# Patient Record
Sex: Male | Born: 1990 | Race: White | Hispanic: No | Marital: Single | Smoking: Current every day smoker
Health system: Southern US, Community
[De-identification: ages and names within clinical notes are randomized; demographics above are authoritative.]

## PROBLEM LIST (undated history)

## (undated) DIAGNOSIS — F419 Anxiety disorder, unspecified: Secondary | ICD-10-CM

## (undated) DIAGNOSIS — F1910 Other psychoactive substance abuse, uncomplicated: Secondary | ICD-10-CM

## (undated) DIAGNOSIS — J45909 Unspecified asthma, uncomplicated: Secondary | ICD-10-CM

## (undated) DIAGNOSIS — R569 Unspecified convulsions: Secondary | ICD-10-CM

## (undated) HISTORY — PX: ORBITAL FRACTURE SURGERY: SHX725

---
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
## 2015-04-16 ENCOUNTER — Encounter (HOSPITAL_COMMUNITY): Payer: Self-pay | Admitting: Emergency Medicine

## 2015-04-16 ENCOUNTER — Inpatient Hospital Stay (HOSPITAL_COMMUNITY)
Admission: EM | Admit: 2015-04-16 | Discharge: 2015-04-18 | DRG: 917 | Disposition: A | Discharge status: Home / Self Care | Payer: Self-pay | Attending: Internal Medicine | Admitting: Internal Medicine

## 2015-04-16 DIAGNOSIS — F1910 Other psychoactive substance abuse, uncomplicated: Secondary | ICD-10-CM | POA: Diagnosis present

## 2015-04-16 DIAGNOSIS — T401X1A Poisoning by heroin, accidental (unintentional), initial encounter: Principal | ICD-10-CM | POA: Diagnosis present

## 2015-04-16 DIAGNOSIS — R404 Transient alteration of awareness: Secondary | ICD-10-CM | POA: Diagnosis present

## 2015-04-16 DIAGNOSIS — Y907 Blood alcohol level of 200-239 mg/100 ml: Secondary | ICD-10-CM | POA: Diagnosis present

## 2015-04-16 DIAGNOSIS — J45901 Unspecified asthma with (acute) exacerbation: Secondary | ICD-10-CM | POA: Diagnosis present

## 2015-04-16 DIAGNOSIS — R0602 Shortness of breath: Secondary | ICD-10-CM

## 2015-04-16 DIAGNOSIS — Y92009 Unspecified place in unspecified non-institutional (private) residence as the place of occurrence of the external cause: Secondary | ICD-10-CM

## 2015-04-16 DIAGNOSIS — F1410 Cocaine abuse, uncomplicated: Secondary | ICD-10-CM | POA: Diagnosis present

## 2015-04-16 DIAGNOSIS — F1310 Sedative, hypnotic or anxiolytic abuse, uncomplicated: Secondary | ICD-10-CM | POA: Diagnosis present

## 2015-04-16 DIAGNOSIS — F17210 Nicotine dependence, cigarettes, uncomplicated: Secondary | ICD-10-CM | POA: Diagnosis present

## 2015-04-16 DIAGNOSIS — J9601 Acute respiratory failure with hypoxia: Secondary | ICD-10-CM | POA: Diagnosis present

## 2015-04-16 DIAGNOSIS — J9602 Acute respiratory failure with hypercapnia: Secondary | ICD-10-CM | POA: Diagnosis present

## 2015-04-16 DIAGNOSIS — F1010 Alcohol abuse, uncomplicated: Secondary | ICD-10-CM | POA: Diagnosis present

## 2015-04-16 DIAGNOSIS — R4189 Other symptoms and signs involving cognitive functions and awareness: Secondary | ICD-10-CM | POA: Diagnosis present

## 2015-04-16 LAB — COMPREHENSIVE METABOLIC PANEL
ALT: 20 U/L (ref 17–63)
AST: 32 U/L (ref 15–41)
Albumin: 3.9 g/dL (ref 3.5–5.0)
Alkaline Phosphatase: 72 U/L (ref 38–126)
Anion gap: 13 (ref 5–15)
BUN: <5 mg/dL — ABNORMAL LOW (ref 6–20)
CO2: 21 mmol/L — ABNORMAL LOW (ref 22–32)
Calcium: 8.8 mg/dL — ABNORMAL LOW (ref 8.9–10.3)
Chloride: 101 mmol/L (ref 101–111)
Creatinine, Ser: 0.91 mg/dL (ref 0.61–1.24)
GFR calc Af Amer: >60 mL/min (ref >60)
GFR calc non Af Amer: >60 mL/min (ref >60)
Glucose, Bld: 153 mg/dL — ABNORMAL HIGH (ref 65–99)
Potassium: 3.7 mmol/L (ref 3.5–5.1)
Sodium: 135 mmol/L (ref 135–145)
Total Bilirubin: 0.7 mg/dL (ref 0.3–1.2)
Total Protein: 7.4 g/dL (ref 6.5–8.1)

## 2015-04-16 LAB — RAPID URINE DRUG SCREEN, HOSP PERFORMED
Amphetamines: NOT DETECTED
Barbiturates: NOT DETECTED
Benzodiazepines: POSITIVE — AB
Cocaine: POSITIVE — AB
Opiates: POSITIVE — AB
Tetrahydrocannabinol: NOT DETECTED

## 2015-04-16 LAB — ACETAMINOPHEN LEVEL: Acetaminophen (Tylenol), Serum: <10 ug/mL — ABNORMAL LOW (ref 10–30)

## 2015-04-16 LAB — CBC WITH DIFFERENTIAL/PLATELET
Basophils Absolute: 0 10*3/uL (ref 0.0–0.1)
Basophils Relative: 0 % (ref 0–1)
Eosinophils Absolute: 0.5 10*3/uL (ref 0.0–0.7)
Eosinophils Relative: 7 % — ABNORMAL HIGH (ref 0–5)
HCT: 40.7 % (ref 39.0–52.0)
Hemoglobin: 14.4 g/dL (ref 13.0–17.0)
Lymphocytes Relative: 37 % (ref 12–46)
Lymphs Abs: 2.9 10*3/uL (ref 0.7–4.0)
MCH: 31.9 pg (ref 26.0–34.0)
MCHC: 35.4 g/dL (ref 30.0–36.0)
MCV: 90.2 fL (ref 78.0–100.0)
Monocytes Absolute: 0.5 10*3/uL (ref 0.1–1.0)
Monocytes Relative: 7 % (ref 3–12)
Neutro Abs: 3.8 10*3/uL (ref 1.7–7.7)
Neutrophils Relative %: 49 % (ref 43–77)
Platelets: 254 10*3/uL (ref 150–400)
RBC: 4.51 MIL/uL (ref 4.22–5.81)
RDW: 14.5 % (ref 11.5–15.5)
WBC: 7.8 10*3/uL (ref 4.0–10.5)

## 2015-04-16 LAB — SALICYLATE LEVEL: Salicylate Lvl: <4 mg/dL (ref 2.8–30.0)

## 2015-04-16 LAB — ETHANOL: Alcohol, Ethyl (B): 202 mg/dL — ABNORMAL HIGH (ref <5)

## 2015-04-16 LAB — I-STAT TROPONIN, ED: Troponin i, poc: 0.01 ng/mL (ref 0.00–0.08)

## 2015-04-16 MED ORDER — ONDANSETRON HCL 4 MG/2ML IJ SOLN
4.0000 mg | Freq: Once | INTRAMUSCULAR | Status: AC
  Administered 2015-04-17: 4 mg via INTRAVENOUS
  Filled 2015-04-16: qty 2

## 2015-04-16 MED ORDER — SODIUM CHLORIDE 0.9 % IV BOLUS (SEPSIS)
1000.0000 mL | Freq: Once | INTRAVENOUS | Status: AC
  Administered 2015-04-16: 1000 mL via INTRAVENOUS

## 2015-04-16 MED ORDER — CLONIDINE HCL 0.1 MG PO TABS
0.1000 mg | ORAL_TABLET | Freq: Once | ORAL | Status: AC
  Administered 2015-04-17: 0.1 mg via ORAL
  Filled 2015-04-16: qty 1

## 2015-04-16 NOTE — ED Notes (Signed)
Per EMS: pt found unresponsive and not breathing by father at 2130, upon arrival by fire department pt received 2mg  of narcan and became responsive. Pt states unknown amount of ingestion and unknown time frame. Pt alert and oriented, nad noted upon arrival. Pt noted to have alcohol problem and also states he takes xanax q3h that he gets for his panic attacks.
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
## 2015-04-16 NOTE — ED Notes (Signed)
Spoke with Sheryl at poison control, recommend that pt gets EKG done and reassessed 4 hours after last dose of narcan. Dr. Silverio Lay notified.

## 2015-04-17 ENCOUNTER — Inpatient Hospital Stay (HOSPITAL_COMMUNITY): Payer: Self-pay

## 2015-04-17 DIAGNOSIS — R4189 Other symptoms and signs involving cognitive functions and awareness: Secondary | ICD-10-CM | POA: Diagnosis present

## 2015-04-17 DIAGNOSIS — F1910 Other psychoactive substance abuse, uncomplicated: Secondary | ICD-10-CM | POA: Diagnosis present

## 2015-04-17 DIAGNOSIS — J45901 Unspecified asthma with (acute) exacerbation: Secondary | ICD-10-CM | POA: Diagnosis present

## 2015-04-17 DIAGNOSIS — T401X1A Poisoning by heroin, accidental (unintentional), initial encounter: Secondary | ICD-10-CM | POA: Diagnosis present

## 2015-04-17 DIAGNOSIS — J9601 Acute respiratory failure with hypoxia: Secondary | ICD-10-CM

## 2015-04-17 DIAGNOSIS — J9602 Acute respiratory failure with hypercapnia: Secondary | ICD-10-CM

## 2015-04-17 LAB — CBC WITH DIFFERENTIAL/PLATELET
BASOS ABS: 0 10*3/uL (ref 0.0–0.1)
BASOS PCT: 0 % (ref 0–1)
EOS ABS: 0.1 10*3/uL (ref 0.0–0.7)
EOS PCT: 1 % (ref 0–5)
HEMATOCRIT: 38.9 % — AB (ref 39.0–52.0)
Hemoglobin: 13.5 g/dL (ref 13.0–17.0)
LYMPHS ABS: 0.6 10*3/uL — AB (ref 0.7–4.0)
Lymphocytes Relative: 7 % — ABNORMAL LOW (ref 12–46)
MCH: 31.5 pg (ref 26.0–34.0)
MCHC: 34.7 g/dL (ref 30.0–36.0)
MCV: 90.9 fL (ref 78.0–100.0)
Monocytes Absolute: 0.2 10*3/uL (ref 0.1–1.0)
Monocytes Relative: 2 % — ABNORMAL LOW (ref 3–12)
NEUTROS ABS: 7.3 10*3/uL (ref 1.7–7.7)
Neutrophils Relative %: 90 % — ABNORMAL HIGH (ref 43–77)
Platelets: 222 10*3/uL (ref 150–400)
RBC: 4.28 MIL/uL (ref 4.22–5.81)
RDW: 14.7 % (ref 11.5–15.5)
WBC: 8.2 10*3/uL (ref 4.0–10.5)

## 2015-04-17 LAB — COMPREHENSIVE METABOLIC PANEL
ALBUMIN: 3.5 g/dL (ref 3.5–5.0)
ALK PHOS: 67 U/L (ref 38–126)
ALT: 18 U/L (ref 17–63)
AST: 23 U/L (ref 15–41)
Anion gap: 9 (ref 5–15)
BILIRUBIN TOTAL: 0.7 mg/dL (ref 0.3–1.2)
BUN: <5 mg/dL — ABNORMAL LOW (ref 6–20)
CHLORIDE: 101 mmol/L (ref 101–111)
CO2: 25 mmol/L (ref 22–32)
Calcium: 9.2 mg/dL (ref 8.9–10.3)
Creatinine, Ser: 0.76 mg/dL (ref 0.61–1.24)
Glucose, Bld: 138 mg/dL — ABNORMAL HIGH (ref 65–99)
POTASSIUM: 4.9 mmol/L (ref 3.5–5.1)
Sodium: 135 mmol/L (ref 135–145)
Total Protein: 6.9 g/dL (ref 6.5–8.1)

## 2015-04-17 LAB — I-STAT ARTERIAL BLOOD GAS, ED
ACID-BASE DEFICIT: 4 mmol/L — AB (ref 0.0–2.0)
Bicarbonate: 23.3 mEq/L (ref 20.0–24.0)
O2 Saturation: 97 %
PH ART: 7.286 — AB (ref 7.350–7.450)
TCO2: 25 mmol/L (ref 0–100)
pCO2 arterial: 48.5 mmHg — ABNORMAL HIGH (ref 35.0–45.0)
pO2, Arterial: 97 mmHg (ref 80.0–100.0)

## 2015-04-17 LAB — EXPECTORATED SPUTUM ASSESSMENT W REFEX TO RESP CULTURE

## 2015-04-17 LAB — EXPECTORATED SPUTUM ASSESSMENT W GRAM STAIN, RFLX TO RESP C

## 2015-04-17 LAB — MRSA PCR SCREENING: MRSA by PCR: POSITIVE — AB

## 2015-04-17 MED ORDER — ONDANSETRON HCL 4 MG/2ML IJ SOLN: 4.0000 mg | Freq: Four times a day (QID) | INTRAMUSCULAR | Status: DC | PRN

## 2015-04-17 MED ORDER — DICYCLOMINE HCL 20 MG PO TABS
20.0000 mg | ORAL_TABLET | Freq: Four times a day (QID) | ORAL | Status: DC | PRN
  Filled 2015-04-17: qty 1

## 2015-04-17 MED ORDER — IPRATROPIUM-ALBUTEROL 0.5-2.5 (3) MG/3ML IN SOLN
3.0000 mL | RESPIRATORY_TRACT | Status: DC
  Administered 2015-04-17 – 2015-04-18 (×7): 3 mL via RESPIRATORY_TRACT
  Filled 2015-04-17 (×6): qty 3

## 2015-04-17 MED ORDER — METHOCARBAMOL 500 MG PO TABS
500.0000 mg | ORAL_TABLET | Freq: Three times a day (TID) | ORAL | Status: DC | PRN
  Filled 2015-04-17: qty 1

## 2015-04-17 MED ORDER — ACETAMINOPHEN 325 MG PO TABS: 650.0000 mg | ORAL_TABLET | Freq: Four times a day (QID) | ORAL | Status: DC | PRN

## 2015-04-17 MED ORDER — THIAMINE HCL 100 MG/ML IJ SOLN
Freq: Once | INTRAVENOUS | Status: AC
  Administered 2015-04-17: 07:00:00 via INTRAVENOUS
  Filled 2015-04-17: qty 1000

## 2015-04-17 MED ORDER — IPRATROPIUM-ALBUTEROL 0.5-2.5 (3) MG/3ML IN SOLN
3.0000 mL | RESPIRATORY_TRACT | Status: DC | PRN
  Filled 2015-04-17: qty 3

## 2015-04-17 MED ORDER — NAPROXEN 500 MG PO TABS
500.0000 mg | ORAL_TABLET | Freq: Two times a day (BID) | ORAL | Status: DC | PRN
  Filled 2015-04-17: qty 1

## 2015-04-17 MED ORDER — LOPERAMIDE HCL 2 MG PO CAPS
2.0000 mg | ORAL_CAPSULE | ORAL | Status: DC | PRN
  Filled 2015-04-17: qty 2

## 2015-04-17 MED ORDER — CLONIDINE HCL 0.1 MG PO TABS: 0.1000 mg | ORAL_TABLET | Freq: Every day | ORAL | Status: DC

## 2015-04-17 MED ORDER — DEXTROSE 5 % IV SOLN
1.0000 g | INTRAVENOUS | Status: DC
  Administered 2015-04-17 – 2015-04-18 (×2): 1 g via INTRAVENOUS
  Filled 2015-04-17 (×4): qty 10

## 2015-04-17 MED ORDER — DIAZEPAM 5 MG/ML IJ SOLN: 5.0000 mg | INTRAMUSCULAR | Status: DC | PRN

## 2015-04-17 MED ORDER — CLONIDINE HCL 0.1 MG PO TABS: 0.1000 mg | ORAL_TABLET | ORAL | Status: DC

## 2015-04-17 MED ORDER — MUPIROCIN 2 % EX OINT
1.0000 "application " | TOPICAL_OINTMENT | Freq: Two times a day (BID) | CUTANEOUS | Status: DC
  Administered 2015-04-17 (×2): 1 via NASAL
  Filled 2015-04-17: qty 22

## 2015-04-17 MED ORDER — METHYLPREDNISOLONE SODIUM SUCC 125 MG IJ SOLR
60.0000 mg | Freq: Four times a day (QID) | INTRAMUSCULAR | Status: DC
  Administered 2015-04-17 – 2015-04-18 (×6): 60 mg via INTRAVENOUS
  Filled 2015-04-17 (×3): qty 0.96
  Filled 2015-04-17: qty 2
  Filled 2015-04-17 (×6): qty 0.96

## 2015-04-17 MED ORDER — CHLORHEXIDINE GLUCONATE CLOTH 2 % EX PADS
6.0000 | MEDICATED_PAD | Freq: Every day | CUTANEOUS | Status: DC
  Administered 2015-04-17: 6 via TOPICAL

## 2015-04-17 MED ORDER — NALOXONE HCL 0.4 MG/ML IJ SOLN: 0.4000 mg | INTRAMUSCULAR | Status: DC | PRN

## 2015-04-17 MED ORDER — DEXTROSE 5 % IV SOLN
500.0000 mg | INTRAVENOUS | Status: DC
  Administered 2015-04-17 (×2): 500 mg via INTRAVENOUS
  Filled 2015-04-17 (×4): qty 500

## 2015-04-17 MED ORDER — SODIUM CHLORIDE 0.9 % IJ SOLN
3.0000 mL | Freq: Two times a day (BID) | INTRAMUSCULAR | Status: DC
  Administered 2015-04-17 (×2): 3 mL via INTRAVENOUS

## 2015-04-17 MED ORDER — ENOXAPARIN SODIUM 40 MG/0.4ML ~~LOC~~ SOLN
40.0000 mg | Freq: Every day | SUBCUTANEOUS | Status: DC
  Administered 2015-04-17: 40 mg via SUBCUTANEOUS
  Filled 2015-04-17 (×2): qty 0.4

## 2015-04-17 MED ORDER — ONDANSETRON HCL 4 MG PO TABS: 4.0000 mg | ORAL_TABLET | Freq: Four times a day (QID) | ORAL | Status: DC | PRN

## 2015-04-17 MED ORDER — CLONIDINE HCL 0.1 MG PO TABS
0.1000 mg | ORAL_TABLET | Freq: Four times a day (QID) | ORAL | Status: DC
  Administered 2015-04-17 – 2015-04-18 (×4): 0.1 mg via ORAL
  Filled 2015-04-17 (×9): qty 1

## 2015-04-17 MED ORDER — ACETAMINOPHEN 650 MG RE SUPP: 650.0000 mg | Freq: Four times a day (QID) | RECTAL | Status: DC | PRN

## 2015-04-17 MED ORDER — NALOXONE HCL 1 MG/ML IJ SOLN
0.2500 mg/h | INTRAVENOUS | Status: DC
  Filled 2015-04-17: qty 4

## 2015-04-17 NOTE — ED Notes (Signed)
Attempted report 
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
## 2015-04-17 NOTE — ED Notes (Addendum)
Pt O2 level decreased to 87-88% on RA, upon entering room pt eyes were closed but family states he had been talking with them, spoke with patient and got him to engage in conversation, O2 level remained 88-90% on RA, placed on 2L [place], MD Silverio Lay informed.

## 2015-04-17 NOTE — ED Notes (Signed)
MD Patel at bedside.

## 2015-04-17 NOTE — Progress Notes (Signed)
ANTIBIOTIC CONSULT NOTE - INITIAL  Pharmacy Consult for Ceftriaxone  Indication: Asthma Exacerbation   No Known Allergies  Vital Signs: Temp: 97.6 F (36.4 C) (06/10 2220) Temp Source: Oral (06/10 2220) BP: 104/44 mmHg (06/11 0030) Pulse Rate: 80 (06/11 0030)  Labs:  Recent Labs  04/16/15 2232  WBC 7.8  HGB 14.4  PLT 254  CREATININE 0.91    Assessment: 24 y/o M here with drug overdose, to start anti-biotics for asthma exacerbation, WBC WNL, other labs as above.   Plan:  -Ceftriaxone 1g IV q24h -Azithromycin per MD -Trend clinical status  Abran Duke 04/17/2015,1:33 AM

## 2015-04-17 NOTE — ED Notes (Signed)
Pt O2 levels have remains 97-100% while on 2L, pt sleeping but awakes to voice, respirations even and nonlabored, no distress noted

## 2015-04-17 NOTE — Progress Notes (Signed)
Rt note: Pt not on bipap at this time.  Rt will continue to monitor. 

## 2015-04-17 NOTE — Progress Notes (Signed)
[facility] PROGRESS NOTE   Bryce Hughes TDV:[id number] DOB: 26-Apr-1991 DOA: 04/16/2015 PCP: No primary care provider on file.  HPI/Subjective: Seen with his father at bedside along with a nursing staff. No complaints so far.  Assessment/Plan: Principal Problem:   Acute respiratory failure with hypoxia and hypercarbia Active Problems:   Heroin overdose   Asthma with acute exacerbation   Polysubstance abuse   Unresponsive episode   This is no charge note, patient seen earlier today by my colleague Dr. Allena Katz. Found by family unconscious, history of polysubstance abuse. According to him he was recently drinking a lot of alcohol and heroin. Watch for withdrawal symptoms. Clinical social work to evaluate for substance abuse  Code Status: Full Code Family Communication: Plan discussed with the patient. Disposition Plan: Remains inpatient Diet: Diet regular Room service appropriate?: Yes; Fluid consistency:: Thin  Consultants:  None  Procedures:  None  Antibiotics:  None   Objective: Filed Vitals:   04/17/15 1125  BP: 103/57  Pulse: 61  Temp:   Resp: 14    Intake/Output Summary (Last 24 hours) at 04/17/15 1241 Last data filed at 04/17/15 1000  Gross per 24 hour  Intake      0 ml  Output    975 ml  Net   -975 ml   Filed Weights   04/17/15 0519  Weight: 74.2 kg (163 lb 9.3 oz)    Exam: General: Alert and awake, oriented x3, not in any acute distress. HEENT: anicteric sclera, pupils reactive to light and accommodation, EOMI CVS: S1-S2 clear, no murmur rubs or gallops Chest: clear to auscultation bilaterally, no wheezing, rales or rhonchi Abdomen: soft nontender, nondistended, normal bowel sounds, no organomegaly Extremities: no cyanosis, clubbing or edema noted bilaterally Neuro: Cranial nerves II-XII intact, no focal neurological deficits  Data Reviewed: Basic Metabolic Panel:  Recent Labs Lab 04/16/15 2232 04/17/15 0704  NA 135 135    K 3.7 4.9  CL 101 101  CO2 21* 25  GLUCOSE 153* 138*  BUN <5* <5*  CREATININE 0.91 0.76  CALCIUM 8.8* 9.2   Liver Function Tests:  Recent Labs Lab 04/16/15 2232 04/17/15 0704  AST 32 23  ALT 20 18  ALKPHOS 72 67  BILITOT 0.7 0.7  PROT 7.4 6.9  ALBUMIN 3.9 3.5   No results for input(s): LIPASE, AMYLASE in the last 168 hours. No results for input(s): AMMONIA in the last 168 hours. CBC:  Recent Labs Lab 04/16/15 2232 04/17/15 0704  WBC 7.8 8.2  NEUTROABS 3.8 7.3  HGB 14.4 13.5  HCT 40.7 38.9*  MCV 90.2 90.9  PLT 254 222   Cardiac Enzymes: No results for input(s): CKTOTAL, CKMB, CKMBINDEX, TROPONINI in the last 168 hours. BNP (last 3 results) No results for input(s): BNP in the last 8760 hours.  ProBNP (last 3 results) No results for input(s): PROBNP in the last 8760 hours.  CBG: No results for input(s): GLUCAP in the last 168 hours.  Micro Recent Results (from the past 240 hour(s))  MRSA PCR Screening     Status: Abnormal   Collection Time: 04/17/15  2:09 AM  Result Value Ref Range Status   MRSA by PCR POSITIVE (A) NEGATIVE Final    Comment:        The GeneXpert MRSA Assay (FDA approved for NASAL specimens only), is one component of a comprehensive MRSA colonization surveillance program. It is not intended to diagnose MRSA infection nor to guide or monitor treatment for MRSA infections. RESULT CALLED TO,  READ BACK BY AND VERIFIED WITH: A.DUNDON,RN 1610 04/17/15 M.CAMPBELL   Culture, sputum-assessment     Status: None   Collection Time: 04/17/15  3:20 AM  Result Value Ref Range Status   Specimen Description SPUTUM  Final   Special Requests NONE  Final   Sputum evaluation   Final    THIS SPECIMEN IS ACCEPTABLE. RESPIRATORY CULTURE REPORT TO FOLLOW.   Report Status 04/17/2015 FINAL  Final     Studies: Dg Chest Port 1 View  04/17/2015   CLINICAL DATA:  Acute onset of shortness of breath. Initial encounter.  EXAM: PORTABLE CHEST - 1 VIEW   COMPARISON:  Chest radiograph performed 03/12/2006  FINDINGS: The lungs are well-aerated and clear. There is no evidence of focal opacification, pleural effusion or pneumothorax.  The cardiomediastinal silhouette is within normal limits. No acute osseous abnormalities are seen.  IMPRESSION: No acute cardiopulmonary process seen.   Electronically Signed   By: Roanna Raider M.D.   On: 04/17/2015 02:58    Scheduled Meds: . azithromycin  500 mg Intravenous Q24H  . cefTRIAXone (ROCEPHIN)  IV  1 g Intravenous Q24H  . Chlorhexidine Gluconate Cloth  6 each Topical Q0600  . cloNIDine  0.1 mg Oral QID   Followed by  . [START ON 04/19/2015] cloNIDine  0.1 mg Oral BH-qamhs   Followed by  . [START ON 04/22/2015] cloNIDine  0.1 mg Oral QAC breakfast  . enoxaparin (LOVENOX) injection  40 mg Subcutaneous Daily  . ipratropium-albuterol  3 mL Nebulization Q4H  . methylPREDNISolone (SOLU-MEDROL) injection  60 mg Intravenous Q6H  . mupirocin ointment  1 application Nasal BID  . sodium chloride  3 mL Intravenous Q12H   Continuous Infusions:      Time spent: 35 minutes    [facility]  [facility] Pager [phone] If 7PM-7AM, please contact night-coverage at [email or web address], password [facility] 04/17/2015, 12:41 PM  LOS: 0 days

## 2015-04-18 DIAGNOSIS — T401X1A Poisoning by heroin, accidental (unintentional), initial encounter: Principal | ICD-10-CM

## 2015-04-18 DIAGNOSIS — R404 Transient alteration of awareness: Secondary | ICD-10-CM

## 2015-04-18 DIAGNOSIS — F1910 Other psychoactive substance abuse, uncomplicated: Secondary | ICD-10-CM

## 2015-04-18 DIAGNOSIS — J45901 Unspecified asthma with (acute) exacerbation: Secondary | ICD-10-CM

## 2015-04-18 NOTE — Progress Notes (Signed)
Discharge instructions given. No questions or concerns at this time. D/C'd via family vehicle.

## 2015-04-18 NOTE — Discharge Summary (Signed)
Physician Discharge Summary  [name]:[id number] DOB: [birth year] DOA: 04/16/2015  PCP: No primary care provider on file.  Admit date: 04/16/2015 Discharge date: 04/18/2015  Time spent: 40 minutes  Recommendations for Outpatient Follow-up:  1. Patient is applying for Medicaid, reported he will seek professional help for his addiction. 2. Patient seen by CSW and resources for [facility] addiction help provided (please see CSW note).  Discharge Diagnoses:  Principal Problem:   Acute respiratory failure with hypoxia and hypercarbia Active Problems:   Heroin overdose   Asthma with acute exacerbation   Polysubstance abuse   Unresponsive episode   Discharge Condition: Stable  Diet recommendation: Heart healthy  Filed Weights   04/17/15 0519 04/18/15 0516  Weight: 74.2 kg (163 lb 9.3 oz) 74.2 kg (163 lb 9.3 oz)    History of present illness:  Bryce Hughes is a 24 y.o. male with Past medical history of polysubstance abuse. The patient was brought in by family as he was found unresponsive at home. Reportedly the patient was at home alone and has been injecting heroin in the afternoon if 7 PM and later on he was found unresponsive at home and EMS was called and the patient was brought to the hospital. As per the family he was not breathing but he did not loss any pulse. They were giving him mouth to mouth breathing at home but no CPR required.  There was no fall no trauma no injury reported and there was no incontinence of bowel or bladder reported. Patient denies any suicidal or homicidal ideation. She mentions she injects heroin 4-5 times per day on a regular basis. He also drinks beer on a regular basis to 12 cans a day. he also smokes cigarettes daily. He mentions he has abused cocaine 2 months ago. Uses Xanax on a daily basis which is also not prescribed. Uses 1 mg of Xanax on a daily basis last used early in the morning. Patient denies any complaints of  dizziness or headache or any focal deficit any chest pain any abdominal pain. He compresses of some nausea and also, some shortness of breath as well as dry cough. He has history of asthma in the past. He denies any diarrhea or constipation burning urination.   The patient is coming from home. And at his baseline independent for most of his ADL.  Hospital Course:   Acute respiratory failure with mild hypercapnia Secondary to heroin overdose, as mentioned in the H&P patient was found by family unconscious. Started CPR and mouth-to-mouth breathing and 911 called. Per report no loss of pulse but he was found not breathing. The patient will come in the ED complaining about some nausea, SOB and dry cough. Patient recovered that he is back to his baseline, he is on room air sats in the upper 90s.  Polysubstance abuse Patient reported heroin and alcohol abuse, his UDS is positive for narcotics, benzos and cocaine. Patient is not working, reported that he takes money from his dad to feed his habits. Seen by clinical social worker, resources for help with addiction given. Patient (and father at bedside) do not want to go to inpatient rehabilitation, patient requests to go home. Patient is not committable, he reported to the RN "he has to leave".  Patient discharged to follow-up with polysubstance rehabilitation as outpatient. On the day of discharge patient was not showing any signs of withdrawal, no tremors, no sweating, no nausea, no vomiting, no abdominal pain or diarrhea.  IV drug  use No signs of infection, no fever chills or leukocytosis. I couldn't appreciate any murmur.   Procedures:  None  Consultations:  CSW  Discharge Exam: Filed Vitals:   04/18/15 0516  BP: 106/51  Pulse: 50  Temp: 98.2 F (36.8 C)  Resp: 20   General: Alert and awake, oriented x3, not in any acute distress. HEENT: anicteric sclera, pupils reactive to light and accommodation, EOMI CVS: S1-S2 clear,  no murmur rubs or gallops Chest: clear to auscultation bilaterally, no wheezing, rales or rhonchi Abdomen: soft nontender, nondistended, normal bowel sounds, no organomegaly Extremities: no cyanosis, clubbing or edema noted bilaterally Neuro: Cranial nerves II-XII intact, no focal neurological deficits  Discharge Instructions   Discharge Instructions    Increase activity slowly    Complete by:  As directed           Current Discharge Medication List    STOP taking these medications     ALPRAZolam (XANAX) 1 MG tablet        No Known Allergies    The results of significant diagnostics from this hospitalization (including imaging, microbiology, ancillary and laboratory) are listed below for reference.    Significant Diagnostic Studies: Dg Chest Port 1 View  04/17/2015   CLINICAL DATA:  Acute onset of shortness of breath. Initial encounter.  EXAM: PORTABLE CHEST - 1 VIEW  COMPARISON:  Chest radiograph performed 03/12/2006  FINDINGS: The lungs are well-aerated and clear. There is no evidence of focal opacification, pleural effusion or pneumothorax.  The cardiomediastinal silhouette is within normal limits. No acute osseous abnormalities are seen.  IMPRESSION: No acute cardiopulmonary process seen.   Electronically Signed   By: Roanna Raider M.D.   On: 04/17/2015 02:58    Microbiology: Recent Results (from the past 240 hour(s))  MRSA PCR Screening     Status: Abnormal   Collection Time: 04/17/15  2:09 AM  Result Value Ref Range Status   MRSA by PCR POSITIVE (A) NEGATIVE Final    Comment:        The GeneXpert MRSA Assay (FDA approved for NASAL specimens only), is one component of a comprehensive MRSA colonization surveillance program. It is not intended to diagnose MRSA infection nor to guide or monitor treatment for MRSA infections. RESULT CALLED TO, READ BACK BY AND VERIFIED WITH: A.DUNDON,RN [id number] 04/17/15 M.CAMPBELL   Culture, sputum-assessment     Status: None    Collection Time: 04/17/15  3:20 AM  Result Value Ref Range Status   Specimen Description SPUTUM  Final   Special Requests NONE  Final   Sputum evaluation   Final    THIS SPECIMEN IS ACCEPTABLE. RESPIRATORY CULTURE REPORT TO FOLLOW.   Report Status 04/17/2015 FINAL  Final     Labs: Basic Metabolic Panel:  Recent Labs Lab 04/16/15 2232 04/17/15 0704  NA 135 135  K 3.7 4.9  CL 101 101  CO2 21* 25  GLUCOSE 153* 138*  BUN <5* <5*  CREATININE 0.91 0.76  CALCIUM 8.8* 9.2   Liver Function Tests:  Recent Labs Lab 04/16/15 2232 04/17/15 0704  AST 32 23  ALT 20 18  ALKPHOS 72 67  BILITOT 0.7 0.7  PROT 7.4 6.9  ALBUMIN 3.9 3.5   No results for input(s): LIPASE, AMYLASE in the last 168 hours. No results for input(s): AMMONIA in the last 168 hours. CBC:  Recent Labs Lab 04/16/15 2232 04/17/15 0704  WBC 7.8 8.2  NEUTROABS 3.8 7.3  HGB 14.4 13.5  HCT 40.7 38.9*  MCV 90.2 90.9  PLT 254 222   Cardiac Enzymes: No results for input(s): CKTOTAL, CKMB, CKMBINDEX, TROPONINI in the last 168 hours. BNP: BNP (last 3 results) No results for input(s): BNP in the last 8760 hours.  ProBNP (last 3 results) No results for input(s): PROBNP in the last 8760 hours.  CBG: No results for input(s): GLUCAP in the last 168 hours.     Signed:  Sekai Nayak A  [facility] 04/18/2015, 10:16 AM

## 2015-04-18 NOTE — Clinical Social Work Note (Signed)
Clinical Social Work Assessment  Patient Details  Name: Bryce Hughes MRN: [id number] Date of Birth: [birth year]  Date of referral:  04/18/15               Reason for consult:  Substance Use/ETOH Abuse, Financial Concerns, [employer], Discharge Planning                Permission sought to share information with:  Family Supports Permission granted to share information::  Yes, Verbal Permission Granted  Name::     Bryce Hughes   Agency::  N/A  Relationship::  Father   Contact Information:  [phone]  Housing/Transportation Living arrangements for the past 2 months:  [place] of Information:  Patient, Parent Patient Interpreter Needed:  None Criminal Activity/Legal Involvement Pertinent to Current Situation/Hospitalization:  No - Comment as needed (Pt never charged for Heroine abuse.) Significant Relationships:  Parents, Friend Lives with:   Pt lives with his Father and Step mother  Do you feel safe going back to the place where you live?  Yes Need for family participation in patient care:  Yes (Comment) (Pt was assessed with Father and Step mother Present. )  Care giving concerns:  No care giving concerns noted at this time.     Social Worker assessment / plan:  CSW met with Pt at the bedside. Pt's father [name] was present, as well as, Pt's stepmother. CSW introduced self and explained that a consult for substance abuse was placed. CSW had Pt give the details of reason for admission. Pt stated, "I guess I was found not breathing" and that, "I think I just got a bad batch of Heroine". Pt's father stated he was the person that found the Pt and started CPR. Pt's dad stated that  "Pt is too young to die", and "He is not suppose to go before me." Pt stated he began using Heroine 1 1/2 years ago and denies any major life events or depression at the time. Pt is unemployed and when asked how he supported Pt's addiction Pt stated "I get by."  Pt currently does not have insurance and is concerned about the cost of the hospitalization. CSW encouraged the Pt to apply for Medicaid once discharged. Pt agreed and Pt's father will assist with that process. Pt was also found with alcohol in his system. Pt stated he began drinking about 2 years ago. Pt does not feel he has alcohol addiction at this time.   CSW gave multiple resources for recovery from Pt's addiction. Resources given were Outpatient treatment programs, Inpt treatment programs, and mental health resources for suppose through events that may trigger drug use in the future. CSW encouraged Pt to begin NA group programs in the community to provide support and mentorship through his recovery. Pt was receptive of all information given and plans to follow up with treatment for his heroine addiction.   Employment status:  Unemployed Forensic scientist:  Self Pay (Medicaid Pending) (Pt was encouraged to apply for Medicaid upon Discharge) PT Recommendations:  Not assessed at this time Information / Referral to community resources:  Outpatient Substance Abuse Treatment Options, Support Groups, Residential Substance Abuse Treatment Options  Patient/Family's Response to care: Father is very involved with Pt and has attempted on several occassions to seek help for Pts addiction. Pts father intends to stand by Pt to provide support through his recovery.   Patient/Family's Understanding of and Emotional Response to Diagnosis, Current Treatment, and Prognosis:  Pt  regrets his use of heroine and is open to treatment programs. Pt does not want inpt programs but will consider partial hospitalization programs. Pt was silent when Pt's father explain the circumstances and realizes the fact Pt almost lost his life.   Emotional Assessment Appearance:  Appears stated age Attitude/Demeanor/Rapport:    Affect (typically observed):  Accepting, Calm Orientation:  Oriented to Self, Oriented to Place, Oriented  to  Time, Oriented to Situation Alcohol / Substance use:  Alcohol Use, Illicit Drugs Psych involvement (Current and /or in the community):  No (Comment)  Discharge Needs  Concerns to be addressed:  Financial / Insurance Concerns, Substance Abuse Concerns Readmission within the last 30 days:  No Current discharge risk:  None Barriers to Discharge:  No Barriers Identified   Pete Pelt 04/18/2015, 9:33 AM

## 2015-04-19 LAB — CULTURE, RESPIRATORY

## 2015-04-19 LAB — CULTURE, RESPIRATORY W GRAM STAIN

## 2015-06-18 ENCOUNTER — Encounter (HOSPITAL_COMMUNITY): Payer: Self-pay | Admitting: Emergency Medicine

## 2015-06-18 ENCOUNTER — Inpatient Hospital Stay (HOSPITAL_COMMUNITY): Payer: Self-pay

## 2015-06-18 ENCOUNTER — Emergency Department (HOSPITAL_COMMUNITY): Payer: Self-pay

## 2015-06-18 ENCOUNTER — Inpatient Hospital Stay (HOSPITAL_COMMUNITY)
Admission: EM | Admit: 2015-06-18 | Discharge: 2015-06-24 | DRG: 853 | Disposition: A | Discharge status: Home / Self Care | Payer: Self-pay | Attending: Internal Medicine | Admitting: Internal Medicine

## 2015-06-18 DIAGNOSIS — R509 Fever, unspecified: Secondary | ICD-10-CM | POA: Insufficient documentation

## 2015-06-18 DIAGNOSIS — J441 Chronic obstructive pulmonary disease with (acute) exacerbation: Secondary | ICD-10-CM | POA: Insufficient documentation

## 2015-06-18 DIAGNOSIS — A491 Streptococcal infection, unspecified site: Secondary | ICD-10-CM | POA: Insufficient documentation

## 2015-06-18 DIAGNOSIS — F1990 Other psychoactive substance use, unspecified, uncomplicated: Secondary | ICD-10-CM | POA: Diagnosis present

## 2015-06-18 DIAGNOSIS — F1010 Alcohol abuse, uncomplicated: Secondary | ICD-10-CM | POA: Diagnosis present

## 2015-06-18 DIAGNOSIS — J45901 Unspecified asthma with (acute) exacerbation: Secondary | ICD-10-CM | POA: Diagnosis present

## 2015-06-18 DIAGNOSIS — F10939 Alcohol use, unspecified with withdrawal, unspecified: Secondary | ICD-10-CM | POA: Insufficient documentation

## 2015-06-18 DIAGNOSIS — L03114 Cellulitis of left upper limb: Secondary | ICD-10-CM | POA: Diagnosis present

## 2015-06-18 DIAGNOSIS — F17210 Nicotine dependence, cigarettes, uncomplicated: Secondary | ICD-10-CM | POA: Diagnosis present

## 2015-06-18 DIAGNOSIS — D62 Acute posthemorrhagic anemia: Secondary | ICD-10-CM | POA: Diagnosis not present

## 2015-06-18 DIAGNOSIS — L0291 Cutaneous abscess, unspecified: Secondary | ICD-10-CM | POA: Insufficient documentation

## 2015-06-18 DIAGNOSIS — L02419 Cutaneous abscess of limb, unspecified: Secondary | ICD-10-CM | POA: Diagnosis present

## 2015-06-18 DIAGNOSIS — G9340 Encephalopathy, unspecified: Secondary | ICD-10-CM | POA: Diagnosis not present

## 2015-06-18 DIAGNOSIS — Y900 Blood alcohol level of less than 20 mg/100 ml: Secondary | ICD-10-CM | POA: Diagnosis present

## 2015-06-18 DIAGNOSIS — F1123 Opioid dependence with withdrawal: Secondary | ICD-10-CM | POA: Diagnosis present

## 2015-06-18 DIAGNOSIS — L03113 Cellulitis of right upper limb: Secondary | ICD-10-CM | POA: Diagnosis present

## 2015-06-18 DIAGNOSIS — Z9889 Other specified postprocedural states: Secondary | ICD-10-CM | POA: Insufficient documentation

## 2015-06-18 DIAGNOSIS — A419 Sepsis, unspecified organism: Principal | ICD-10-CM | POA: Diagnosis present

## 2015-06-18 DIAGNOSIS — B954 Other streptococcus as the cause of diseases classified elsewhere: Secondary | ICD-10-CM | POA: Diagnosis present

## 2015-06-18 DIAGNOSIS — L0390 Cellulitis, unspecified: Secondary | ICD-10-CM

## 2015-06-18 DIAGNOSIS — F1910 Other psychoactive substance abuse, uncomplicated: Secondary | ICD-10-CM | POA: Diagnosis present

## 2015-06-18 DIAGNOSIS — IMO0001 Reserved for inherently not codable concepts without codable children: Secondary | ICD-10-CM | POA: Insufficient documentation

## 2015-06-18 DIAGNOSIS — F10239 Alcohol dependence with withdrawal, unspecified: Secondary | ICD-10-CM | POA: Insufficient documentation

## 2015-06-18 DIAGNOSIS — E871 Hypo-osmolality and hyponatremia: Secondary | ICD-10-CM | POA: Diagnosis present

## 2015-06-18 DIAGNOSIS — E876 Hypokalemia: Secondary | ICD-10-CM | POA: Diagnosis present

## 2015-06-18 DIAGNOSIS — L02413 Cutaneous abscess of right upper limb: Secondary | ICD-10-CM | POA: Diagnosis present

## 2015-06-18 DIAGNOSIS — F10231 Alcohol dependence with withdrawal delirium: Secondary | ICD-10-CM | POA: Diagnosis not present

## 2015-06-18 DIAGNOSIS — F10931 Alcohol use, unspecified with withdrawal delirium: Secondary | ICD-10-CM | POA: Diagnosis not present

## 2015-06-18 LAB — RAPID URINE DRUG SCREEN, HOSP PERFORMED
Amphetamines: NOT DETECTED
BENZODIAZEPINES: POSITIVE — AB
Barbiturates: NOT DETECTED
COCAINE: NOT DETECTED
Opiates: POSITIVE — AB
Tetrahydrocannabinol: NOT DETECTED

## 2015-06-18 LAB — COMPREHENSIVE METABOLIC PANEL
ALBUMIN: 3.7 g/dL (ref 3.5–5.0)
ALT: 12 U/L — AB (ref 17–63)
AST: 19 U/L (ref 15–41)
Alkaline Phosphatase: 80 U/L (ref 38–126)
Anion gap: 14 (ref 5–15)
BILIRUBIN TOTAL: 0.9 mg/dL (ref 0.3–1.2)
BUN: 5 mg/dL — ABNORMAL LOW (ref 6–20)
CHLORIDE: 96 mmol/L — AB (ref 101–111)
CO2: 21 mmol/L — ABNORMAL LOW (ref 22–32)
CREATININE: 0.73 mg/dL (ref 0.61–1.24)
Calcium: 9.1 mg/dL (ref 8.9–10.3)
GFR calc Af Amer: >60 mL/min (ref >60)
GFR calc non Af Amer: >60 mL/min (ref >60)
Glucose, Bld: 103 mg/dL — ABNORMAL HIGH (ref 65–99)
POTASSIUM: 3.8 mmol/L (ref 3.5–5.1)
SODIUM: 131 mmol/L — AB (ref 135–145)
Total Protein: 7.5 g/dL (ref 6.5–8.1)

## 2015-06-18 LAB — URINALYSIS, ROUTINE W REFLEX MICROSCOPIC
Bilirubin Urine: NEGATIVE
GLUCOSE, UA: NEGATIVE mg/dL
HGB URINE DIPSTICK: NEGATIVE
Ketones, ur: NEGATIVE mg/dL
Leukocytes, UA: NEGATIVE
Nitrite: NEGATIVE
Protein, ur: NEGATIVE mg/dL
Specific Gravity, Urine: 1.007 (ref 1.005–1.030)
Urobilinogen, UA: 0.2 mg/dL (ref 0.0–1.0)
pH: 6 (ref 5.0–8.0)

## 2015-06-18 LAB — CBC WITH DIFFERENTIAL/PLATELET
Basophils Absolute: 0 10*3/uL (ref 0.0–0.1)
Basophils Relative: 0 % (ref 0–1)
Eosinophils Absolute: 0 10*3/uL (ref 0.0–0.7)
Eosinophils Relative: 0 % (ref 0–5)
HCT: 45.2 % (ref 39.0–52.0)
HEMOGLOBIN: 16.3 g/dL (ref 13.0–17.0)
LYMPHS ABS: 2 10*3/uL (ref 0.7–4.0)
Lymphocytes Relative: 10 % — ABNORMAL LOW (ref 12–46)
MCH: 34.3 pg — AB (ref 26.0–34.0)
MCHC: 36.1 g/dL — ABNORMAL HIGH (ref 30.0–36.0)
MCV: 95.2 fL (ref 78.0–100.0)
Monocytes Absolute: 2.2 10*3/uL — ABNORMAL HIGH (ref 0.1–1.0)
Monocytes Relative: 11 % (ref 3–12)
NEUTROS PCT: 79 % — AB (ref 43–77)
Neutro Abs: 15.3 10*3/uL — ABNORMAL HIGH (ref 1.7–7.7)
Platelets: 139 10*3/uL — ABNORMAL LOW (ref 150–400)
RBC: 4.75 MIL/uL (ref 4.22–5.81)
RDW: 12.7 % (ref 11.5–15.5)
WBC: 19.5 10*3/uL — ABNORMAL HIGH (ref 4.0–10.5)

## 2015-06-18 LAB — APTT: aPTT: 38 seconds — ABNORMAL HIGH (ref 24–37)

## 2015-06-18 LAB — PROTIME-INR
INR: 1.26 (ref 0.00–1.49)
PROTHROMBIN TIME: 15.9 s — AB (ref 11.6–15.2)

## 2015-06-18 LAB — I-STAT CG4 LACTIC ACID, ED
Lactic Acid, Venous: 1.83 mmol/L (ref 0.5–2.0)
Lactic Acid, Venous: 1.48 mmol/L (ref 0.5–2.0)

## 2015-06-18 MED ORDER — VITAMIN B-1 100 MG PO TABS
100.0000 mg | ORAL_TABLET | Freq: Every day | ORAL | Status: DC
Start: 2015-06-18 — End: 2015-06-18

## 2015-06-18 MED ORDER — ALBUTEROL SULFATE (2.5 MG/3ML) 0.083% IN NEBU: 2.5000 mg | INHALATION_SOLUTION | Freq: Four times a day (QID) | RESPIRATORY_TRACT | Status: DC

## 2015-06-18 MED ORDER — LORAZEPAM 0.5 MG PO TABS
0.0000 mg | ORAL_TABLET | Freq: Four times a day (QID) | ORAL | Status: DC
  Administered 2015-06-19: 4 mg via ORAL
  Administered 2015-06-19: 2 mg via ORAL
  Administered 2015-06-19: 1 mg via ORAL
  Administered 2015-06-20: 4 mg via ORAL
  Filled 2015-06-18: qty 4
  Filled 2015-06-18: qty 2
  Filled 2015-06-18 (×2): qty 8

## 2015-06-18 MED ORDER — LIDOCAINE-EPINEPHRINE (PF) 2 %-1:200000 IJ SOLN
20.0000 mL | Freq: Once | INTRAMUSCULAR | Status: AC
  Administered 2015-06-18: 20 mL
  Filled 2015-06-18: qty 20

## 2015-06-18 MED ORDER — ADULT MULTIVITAMIN W/MINERALS CH
1.0000 | ORAL_TABLET | Freq: Every day | ORAL | Status: DC
  Administered 2015-06-19 – 2015-06-24 (×6): 1 via ORAL
  Filled 2015-06-18 (×7): qty 1

## 2015-06-18 MED ORDER — ACETAMINOPHEN 500 MG PO TABS
1000.0000 mg | ORAL_TABLET | Freq: Once | ORAL | Status: AC
Start: 2015-06-18 — End: 2015-06-18
  Administered 2015-06-18: 1000 mg via ORAL
  Filled 2015-06-18: qty 2

## 2015-06-18 MED ORDER — ALBUTEROL SULFATE (2.5 MG/3ML) 0.083% IN NEBU
5.0000 mg | INHALATION_SOLUTION | Freq: Once | RESPIRATORY_TRACT | Status: AC
  Administered 2015-06-18: 5 mg via RESPIRATORY_TRACT
  Filled 2015-06-18: qty 6

## 2015-06-18 MED ORDER — THIAMINE HCL 100 MG/ML IJ SOLN
100.0000 mg | Freq: Every day | INTRAMUSCULAR | Status: DC
Start: 2015-06-18 — End: 2015-06-18
  Administered 2015-06-18: 100 mg via INTRAVENOUS
  Filled 2015-06-18: qty 2

## 2015-06-18 MED ORDER — PIPERACILLIN-TAZOBACTAM 3.375 G IVPB 30 MIN
3.3750 g | Freq: Once | INTRAVENOUS | Status: AC
  Administered 2015-06-18: 3.375 g via INTRAVENOUS
  Filled 2015-06-18: qty 50

## 2015-06-18 MED ORDER — LORAZEPAM 0.5 MG PO TABS: 0.0000 mg | ORAL_TABLET | Freq: Two times a day (BID) | ORAL | Status: DC

## 2015-06-18 MED ORDER — SODIUM CHLORIDE 0.9 % IV BOLUS (SEPSIS)
500.0000 mL | INTRAVENOUS | Status: AC
  Administered 2015-06-18: 500 mL via INTRAVENOUS

## 2015-06-18 MED ORDER — LORAZEPAM 2 MG/ML IJ SOLN
2.0000 mg | INTRAMUSCULAR | Status: DC | PRN
  Administered 2015-06-19: 3 mg via INTRAVENOUS
  Administered 2015-06-19: 2 mg via INTRAVENOUS
  Administered 2015-06-20: 3 mg via INTRAVENOUS
  Administered 2015-06-20 – 2015-06-23 (×9): 2 mg via INTRAVENOUS
  Filled 2015-06-18: qty 2
  Filled 2015-06-18 (×7): qty 1
  Filled 2015-06-18 (×2): qty 2
  Filled 2015-06-18 (×3): qty 1

## 2015-06-18 MED ORDER — NICOTINE 21 MG/24HR TD PT24
21.0000 mg | MEDICATED_PATCH | Freq: Every day | TRANSDERMAL | Status: DC
  Administered 2015-06-18 – 2015-06-23 (×7): 21 mg via TRANSDERMAL
  Filled 2015-06-18 (×9): qty 1

## 2015-06-18 MED ORDER — FOLIC ACID 5 MG/ML IJ SOLN
1.0000 mg | Freq: Every day | INTRAMUSCULAR | Status: DC
  Administered 2015-06-19 – 2015-06-20 (×2): 1 mg via INTRAVENOUS
  Filled 2015-06-18 (×2): qty 0.2

## 2015-06-18 MED ORDER — SODIUM CHLORIDE 0.9 % IV BOLUS (SEPSIS)
1000.0000 mL | INTRAVENOUS | Status: AC
  Administered 2015-06-18 (×2): 1000 mL via INTRAVENOUS

## 2015-06-18 MED ORDER — ALBUTEROL SULFATE (2.5 MG/3ML) 0.083% IN NEBU: 2.5000 mg | INHALATION_SOLUTION | RESPIRATORY_TRACT | Status: DC | PRN

## 2015-06-18 MED ORDER — VANCOMYCIN HCL IN DEXTROSE 1-5 GM/200ML-% IV SOLN
1000.0000 mg | Freq: Once | INTRAVENOUS | Status: AC
  Administered 2015-06-18: 1000 mg via INTRAVENOUS
  Filled 2015-06-18: qty 200

## 2015-06-18 MED ORDER — VITAMIN B-1 100 MG PO TABS: 100.0000 mg | ORAL_TABLET | Freq: Every day | ORAL | Status: DC

## 2015-06-18 MED ORDER — THIAMINE HCL 100 MG/ML IJ SOLN
100.0000 mg | Freq: Every day | INTRAMUSCULAR | Status: DC
  Administered 2015-06-19 – 2015-06-20 (×2): 100 mg via INTRAVENOUS
  Filled 2015-06-18 (×2): qty 1

## 2015-06-18 NOTE — Progress Notes (Signed)
ANTIBIOTIC CONSULT NOTE - INITIAL  Pharmacy Consult for Vancomycin and Zosyn Indication: cellulitis/abscess  No Known Allergies  Patient Measurements: Height:  (180.3 cm) Weight: 175 lb (79.379 kg) IBW/kg (Calculated) : 75.3  Vital Signs: Temp: 99.8 F (37.7 C) (08/12 2052) Temp Source: Oral (08/12 2052) BP: 116/73 mmHg (08/12 2045) Pulse Rate: 104 (08/12 2045)  Labs:  Recent Labs  06/18/15 1934  WBC 19.5*  HGB 16.3  PLT 139*  CREATININE 0.73   Estimated Creatinine Clearance: 153 mL/min (by C-G formula based on Cr of 0.73).  Assessment:  24 yr old male with hx IV drug use presented with significant cellulitis of right antecubital area with abscess.  Vanc 1 gram IV and Zosyn 3.375 gm IV given in ED ~9pm and to continue.  Tmax 101, WBC 19.5.  Good renal function.  Blood and urine cultures sent.  Goal of Therapy:  Vancomycin trough level 15-20 mcg/ml appropriate Zosyn dose for renal function and infection  Plan:   Continue Vancomycin with 1 gram IV q8hrs.  Continue Zosyn with 3.375 gm IV q8hrs (each over 4 hours).   Will follow renal function, culture data, progress.   Dennie Fetters, [place] Pager: [phone] 06/18/2015,9:20 PM
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
## 2015-06-18 NOTE — ED Notes (Signed)
Pt reports that a week ago he noticed a "bug bite" on his right arm and then three days ago it became swollen and painful.

## 2015-06-18 NOTE — ED Notes (Signed)
Attempted report 
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
## 2015-06-18 NOTE — ED Notes (Signed)
Attempted report x 2 

## 2015-06-18 NOTE — ED Notes (Signed)
Admitting at bedside 
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
## 2015-06-18 NOTE — ED Provider Notes (Signed)
Medical screening examination/treatment/procedure(s) were conducted as a shared visit with non-physician practitioner(s) and myself.  I personally evaluated the patient during the encounter.   EKG Interpretation None      Results for orders placed or performed during the hospital encounter of 04/16/15  Culture, sputum-assessment  Result Value Ref Range   Specimen Description SPUTUM    Special Requests NONE    Sputum evaluation      THIS SPECIMEN IS ACCEPTABLE. RESPIRATORY CULTURE REPORT TO FOLLOW.   Report Status 04/17/2015 FINAL   MRSA PCR Screening  Result Value Ref Range   MRSA by PCR POSITIVE (A) NEGATIVE  Culture, respiratory (NON-Expectorated)  Result Value Ref Range   Specimen Description SPUTUM    Special Requests NONE    Gram Stain      MODERATE WBC PRESENT, PREDOMINANTLY PMN RARE SQUAMOUS EPITHELIAL CELLS PRESENT MODERATE GRAM POSITIVE COCCI IN CLUSTERS IN PAIRS IN CHAINS FEW GRAM NEGATIVE RODS Performed at [facility]    Culture      MODERATE STREPTOCOCCUS,BETA HEMOLYTIC NOT GROUP A Performed at [facility]    Report Status 04/19/2015 FINAL   CBC with Differential  Result Value Ref Range   WBC 7.8 4.0 - 10.5 K/uL   RBC 4.51 4.22 - 5.81 MIL/uL   Hemoglobin 14.4 13.0 - 17.0 g/dL   HCT 16.1 09.6 - 04.5 %   MCV 90.2 78.0 - 100.0 fL   MCH 31.9 26.0 - 34.0 pg   MCHC 35.4 30.0 - 36.0 g/dL   RDW 40.9 81.1 - 91.4 %   Platelets 254 150 - 400 K/uL   Neutrophils Relative % 49 43 - 77 %   Neutro Abs 3.8 1.7 - 7.7 K/uL   Lymphocytes Relative 37 12 - 46 %   Lymphs Abs 2.9 0.7 - 4.0 K/uL   Monocytes Relative 7 3 - 12 %   Monocytes Absolute 0.5 0.1 - 1.0 K/uL   Eosinophils Relative 7 (H) 0 - 5 %   Eosinophils Absolute 0.5 0.0 - 0.7 K/uL   Basophils Relative 0 0 - 1 %   Basophils Absolute 0.0 0.0 - 0.1 K/uL  Comprehensive metabolic panel  Result Value Ref Range   Sodium 135 135 - 145 mmol/L   Potassium 3.7 3.5 - 5.1 mmol/L   Chloride 101 101 - 111  mmol/L   CO2 21 (L) 22 - 32 mmol/L   Glucose, Bld 153 (H) 65 - 99 mg/dL   BUN <5 (L) 6 - 20 mg/dL   Creatinine, Ser 7.82 0.61 - 1.24 mg/dL   Calcium 8.8 (L) 8.9 - 10.3 mg/dL   Total Protein 7.4 6.5 - 8.1 g/dL   Albumin 3.9 3.5 - 5.0 g/dL   AST 32 15 - 41 U/L   ALT 20 17 - 63 U/L   Alkaline Phosphatase 72 38 - 126 U/L   Total Bilirubin 0.7 0.3 - 1.2 mg/dL   GFR calc non Af Amer >60 >60 mL/min   GFR calc Af Amer >60 >60 mL/min   Anion gap 13 5 - 15  Ethanol  Result Value Ref Range   Alcohol, Ethyl (B) 202 (H) <5 mg/dL  Salicylate level  Result Value Ref Range   Salicylate Lvl <4.0 2.8 - 30.0 mg/dL  Acetaminophen level  Result Value Ref Range   Acetaminophen (Tylenol), Serum <10 (L) 10 - 30 ug/mL  Urine rapid drug screen (hosp performed)  Result Value Ref Range   Opiates POSITIVE (A) NONE DETECTED   Cocaine POSITIVE (  A) NONE DETECTED   Benzodiazepines POSITIVE (A) NONE DETECTED   Amphetamines NONE DETECTED NONE DETECTED   Tetrahydrocannabinol NONE DETECTED NONE DETECTED   Barbiturates NONE DETECTED NONE DETECTED  CBC with Differential/Platelet  Result Value Ref Range   WBC 8.2 4.0 - 10.5 K/uL   RBC 4.28 4.22 - 5.81 MIL/uL   Hemoglobin 13.5 13.0 - 17.0 g/dL   HCT 16.1 (L) 09.6 - 04.5 %   MCV 90.9 78.0 - 100.0 fL   MCH 31.5 26.0 - 34.0 pg   MCHC 34.7 30.0 - 36.0 g/dL   RDW 40.9 81.1 - 91.4 %   Platelets 222 150 - 400 K/uL   Neutrophils Relative % 90 (H) 43 - 77 %   Neutro Abs 7.3 1.7 - 7.7 K/uL   Lymphocytes Relative 7 (L) 12 - 46 %   Lymphs Abs 0.6 (L) 0.7 - 4.0 K/uL   Monocytes Relative 2 (L) 3 - 12 %   Monocytes Absolute 0.2 0.1 - 1.0 K/uL   Eosinophils Relative 1 0 - 5 %   Eosinophils Absolute 0.1 0.0 - 0.7 K/uL   Basophils Relative 0 0 - 1 %   Basophils Absolute 0.0 0.0 - 0.1 K/uL  Comprehensive metabolic panel  Result Value Ref Range   Sodium 135 135 - 145 mmol/L   Potassium 4.9 3.5 - 5.1 mmol/L   Chloride 101 101 - 111 mmol/L   CO2 25 22 - 32 mmol/L    Glucose, Bld 138 (H) 65 - 99 mg/dL   BUN <5 (L) 6 - 20 mg/dL   Creatinine, Ser 7.82 0.61 - 1.24 mg/dL   Calcium 9.2 8.9 - 95.6 mg/dL   Total Protein 6.9 6.5 - 8.1 g/dL   Albumin 3.5 3.5 - 5.0 g/dL   AST 23 15 - 41 U/L   ALT 18 17 - 63 U/L   Alkaline Phosphatase 67 38 - 126 U/L   Total Bilirubin 0.7 0.3 - 1.2 mg/dL   GFR calc non Af Amer >60 >60 mL/min   GFR calc Af Amer >60 >60 mL/min   Anion gap 9 5 - 15  I-stat troponin, ED  Result Value Ref Range   Troponin i, poc 0.01 0.00 - 0.08 ng/mL   Comment 3          I-Stat arterial blood gas, ED  Result Value Ref Range   pH, Arterial 7.286 (L) 7.350 - 7.450   pCO2 arterial 48.5 (H) 35.0 - 45.0 mmHg   pO2, Arterial 97.0 80.0 - 100.0 mmHg   Bicarbonate 23.3 20.0 - 24.0 mEq/L   TCO2 25 0 - 100 mmol/L   O2 Saturation 97.0 %   Acid-base deficit 4.0 (H) 0.0 - 2.0 mmol/L   Patient temperature 97.6 F    Collection site RADIAL, ALLEN'S TEST ACCEPTABLE    Drawn by RT    Sample type ARTERIAL    No results found.  Labs above are all old. Patient based on the vital sign parameters does meet sepsis criteria. Patient is IV drug abuser. Patient with significant cellulitis stemming from the right antecubital part of the arm with an abscess measuring probably 3 x 4 cm. We will try to unroofed the abscess superficially could involve the vessels below. We'll not get into deep structures. Patient has sepsis protocol orders in place. He is mentating fine is not hypotensive. Patient will require admission. Labs from today are still pending.  Vanetta Mulders, MD 06/18/15 [phone]

## 2015-06-19 ENCOUNTER — Inpatient Hospital Stay (HOSPITAL_COMMUNITY): Payer: Self-pay

## 2015-06-19 DIAGNOSIS — IMO0001 Reserved for inherently not codable concepts without codable children: Secondary | ICD-10-CM | POA: Insufficient documentation

## 2015-06-19 DIAGNOSIS — L02413 Cutaneous abscess of right upper limb: Secondary | ICD-10-CM

## 2015-06-19 DIAGNOSIS — F1020 Alcohol dependence, uncomplicated: Secondary | ICD-10-CM

## 2015-06-19 DIAGNOSIS — L02419 Cutaneous abscess of limb, unspecified: Secondary | ICD-10-CM | POA: Diagnosis present

## 2015-06-19 DIAGNOSIS — E876 Hypokalemia: Secondary | ICD-10-CM

## 2015-06-19 DIAGNOSIS — Z9889 Other specified postprocedural states: Secondary | ICD-10-CM | POA: Insufficient documentation

## 2015-06-19 DIAGNOSIS — B9689 Other specified bacterial agents as the cause of diseases classified elsewhere: Secondary | ICD-10-CM

## 2015-06-19 DIAGNOSIS — J441 Chronic obstructive pulmonary disease with (acute) exacerbation: Secondary | ICD-10-CM | POA: Insufficient documentation

## 2015-06-19 DIAGNOSIS — J4531 Mild persistent asthma with (acute) exacerbation: Secondary | ICD-10-CM

## 2015-06-19 LAB — BASIC METABOLIC PANEL
ANION GAP: 8 (ref 5–15)
BUN: 6 mg/dL (ref 6–20)
CO2: 25 mmol/L (ref 22–32)
CREATININE: 0.75 mg/dL (ref 0.61–1.24)
Calcium: 8.6 mg/dL — ABNORMAL LOW (ref 8.9–10.3)
Chloride: 100 mmol/L — ABNORMAL LOW (ref 101–111)
GFR calc non Af Amer: >60 mL/min (ref >60)
GLUCOSE: 115 mg/dL — AB (ref 65–99)
Potassium: 3.3 mmol/L — ABNORMAL LOW (ref 3.5–5.1)
Sodium: 133 mmol/L — ABNORMAL LOW (ref 135–145)

## 2015-06-19 LAB — CBC
HEMATOCRIT: 39.7 % (ref 39.0–52.0)
Hemoglobin: 14 g/dL (ref 13.0–17.0)
MCH: 33.3 pg (ref 26.0–34.0)
MCHC: 35.3 g/dL (ref 30.0–36.0)
MCV: 94.5 fL (ref 78.0–100.0)
Platelets: 132 10*3/uL — ABNORMAL LOW (ref 150–400)
RBC: 4.2 MIL/uL — ABNORMAL LOW (ref 4.22–5.81)
RDW: 12.9 % (ref 11.5–15.5)
WBC: 14.1 10*3/uL — ABNORMAL HIGH (ref 4.0–10.5)

## 2015-06-19 LAB — MRSA PCR SCREENING: MRSA by PCR: NEGATIVE

## 2015-06-19 LAB — LACTIC ACID, PLASMA
Lactic Acid, Venous: 1.6 mmol/L (ref 0.5–2.0)
Lactic Acid, Venous: 1.2 mmol/L (ref 0.5–2.0)

## 2015-06-19 LAB — PROCALCITONIN

## 2015-06-19 LAB — MAGNESIUM: Magnesium: 1.6 mg/dL — ABNORMAL LOW (ref 1.7–2.4)

## 2015-06-19 LAB — C-REACTIVE PROTEIN: CRP: 12.9 mg/dL — ABNORMAL HIGH (ref <1.0)

## 2015-06-19 LAB — ETHANOL: Alcohol, Ethyl (B): 7 mg/dL — ABNORMAL HIGH (ref <5)

## 2015-06-19 MED ORDER — SODIUM CHLORIDE 0.9 % IV SOLN
3.0000 g | Freq: Four times a day (QID) | INTRAVENOUS | Status: DC
  Administered 2015-06-19 – 2015-06-24 (×19): 3 g via INTRAVENOUS
  Filled 2015-06-19 (×26): qty 3

## 2015-06-19 MED ORDER — ACETAMINOPHEN 325 MG PO TABS
650.0000 mg | ORAL_TABLET | Freq: Four times a day (QID) | ORAL | Status: DC | PRN
  Filled 2015-06-19: qty 2

## 2015-06-19 MED ORDER — ONDANSETRON HCL 4 MG PO TABS: 4.0000 mg | ORAL_TABLET | Freq: Four times a day (QID) | ORAL | Status: DC | PRN

## 2015-06-19 MED ORDER — MIDAZOLAM HCL 2 MG/2ML IJ SOLN
INTRAMUSCULAR | Status: AC
  Filled 2015-06-19: qty 4

## 2015-06-19 MED ORDER — NAPROXEN 250 MG PO TABS
500.0000 mg | ORAL_TABLET | Freq: Two times a day (BID) | ORAL | Status: AC | PRN
  Administered 2015-06-20 – 2015-06-23 (×5): 500 mg via ORAL
  Filled 2015-06-19 (×6): qty 1

## 2015-06-19 MED ORDER — IPRATROPIUM BROMIDE 0.02 % IN SOLN: 0.5000 mg | RESPIRATORY_TRACT | Status: DC | PRN

## 2015-06-19 MED ORDER — LEVALBUTEROL HCL 0.63 MG/3ML IN NEBU: 0.6300 mg | INHALATION_SOLUTION | RESPIRATORY_TRACT | Status: DC | PRN

## 2015-06-19 MED ORDER — LEVALBUTEROL HCL 0.63 MG/3ML IN NEBU
0.6300 mg | INHALATION_SOLUTION | Freq: Four times a day (QID) | RESPIRATORY_TRACT | Status: DC
  Administered 2015-06-19 (×3): 0.63 mg via RESPIRATORY_TRACT
  Filled 2015-06-19 (×2): qty 3

## 2015-06-19 MED ORDER — LIDOCAINE HCL (CARDIAC) 20 MG/ML IV SOLN
INTRAVENOUS | Status: AC
  Filled 2015-06-19: qty 5

## 2015-06-19 MED ORDER — PROPOFOL 10 MG/ML IV BOLUS
INTRAVENOUS | Status: AC
  Filled 2015-06-19: qty 20

## 2015-06-19 MED ORDER — VANCOMYCIN HCL IN DEXTROSE 1-5 GM/200ML-% IV SOLN
1000.0000 mg | Freq: Three times a day (TID) | INTRAVENOUS | Status: DC
  Administered 2015-06-19 – 2015-06-21 (×7): 1000 mg via INTRAVENOUS
  Filled 2015-06-19 (×9): qty 200

## 2015-06-19 MED ORDER — BENZONATATE 100 MG PO CAPS
200.0000 mg | ORAL_CAPSULE | Freq: Three times a day (TID) | ORAL | Status: DC
  Administered 2015-06-19 – 2015-06-23 (×12): 200 mg via ORAL
  Filled 2015-06-19 (×22): qty 2

## 2015-06-19 MED ORDER — MAGNESIUM SULFATE 50 % IJ SOLN
3.0000 g | Freq: Once | INTRAMUSCULAR | Status: AC
  Administered 2015-06-19: 3 g via INTRAVENOUS
  Filled 2015-06-19: qty 6

## 2015-06-19 MED ORDER — LEVALBUTEROL HCL 0.63 MG/3ML IN NEBU: 0.6300 mg | INHALATION_SOLUTION | Freq: Four times a day (QID) | RESPIRATORY_TRACT | Status: DC | PRN

## 2015-06-19 MED ORDER — ONDANSETRON HCL 4 MG/2ML IJ SOLN
4.0000 mg | Freq: Four times a day (QID) | INTRAMUSCULAR | Status: DC | PRN
Start: 2015-06-19 — End: 2015-06-24

## 2015-06-19 MED ORDER — OXYCODONE HCL 5 MG PO TABS
5.0000 mg | ORAL_TABLET | ORAL | Status: DC | PRN
  Administered 2015-06-19 – 2015-06-23 (×12): 5 mg via ORAL
  Filled 2015-06-19 (×12): qty 1

## 2015-06-19 MED ORDER — HYDROXYZINE HCL 25 MG PO TABS
25.0000 mg | ORAL_TABLET | Freq: Four times a day (QID) | ORAL | Status: AC | PRN
  Administered 2015-06-20 – 2015-06-23 (×10): 25 mg via ORAL
  Filled 2015-06-19 (×11): qty 1

## 2015-06-19 MED ORDER — ACETAMINOPHEN 650 MG RE SUPP: 650.0000 mg | Freq: Four times a day (QID) | RECTAL | Status: DC | PRN

## 2015-06-19 MED ORDER — ONDANSETRON HCL 4 MG/2ML IJ SOLN: 4.0000 mg | Freq: Three times a day (TID) | INTRAMUSCULAR | Status: DC | PRN

## 2015-06-19 MED ORDER — GADOBENATE DIMEGLUMINE 529 MG/ML IV SOLN
15.0000 mL | Freq: Once | INTRAVENOUS | Status: AC | PRN
  Administered 2015-06-19: 15 mL via INTRAVENOUS

## 2015-06-19 MED ORDER — POTASSIUM CHLORIDE CRYS ER 20 MEQ PO TBCR
40.0000 meq | EXTENDED_RELEASE_TABLET | Freq: Once | ORAL | Status: AC
  Administered 2015-06-19: 40 meq via ORAL
  Filled 2015-06-19: qty 2

## 2015-06-19 MED ORDER — ALBUTEROL SULFATE (2.5 MG/3ML) 0.083% IN NEBU: 5.0000 mg | INHALATION_SOLUTION | RESPIRATORY_TRACT | Status: DC | PRN

## 2015-06-19 MED ORDER — ALUM & MAG HYDROXIDE-SIMETH 200-200-20 MG/5ML PO SUSP: 30.0000 mL | Freq: Four times a day (QID) | ORAL | Status: DC | PRN

## 2015-06-19 MED ORDER — PIPERACILLIN-TAZOBACTAM 3.375 G IVPB
3.3750 g | Freq: Three times a day (TID) | INTRAVENOUS | Status: DC
  Administered 2015-06-19: 3.375 g via INTRAVENOUS
  Filled 2015-06-19 (×3): qty 50

## 2015-06-19 MED ORDER — METHYLPREDNISOLONE SODIUM SUCC 125 MG IJ SOLR
60.0000 mg | Freq: Three times a day (TID) | INTRAMUSCULAR | Status: DC
  Administered 2015-06-19 – 2015-06-20 (×4): 60 mg via INTRAVENOUS
  Filled 2015-06-19 (×2): qty 2
  Filled 2015-06-19: qty 0.96
  Filled 2015-06-19: qty 2
  Filled 2015-06-19 (×2): qty 0.96
  Filled 2015-06-19: qty 2

## 2015-06-19 MED ORDER — IPRATROPIUM BROMIDE 0.02 % IN SOLN
0.5000 mg | Freq: Four times a day (QID) | RESPIRATORY_TRACT | Status: DC
  Administered 2015-06-19 (×3): 0.5 mg via RESPIRATORY_TRACT
  Filled 2015-06-19 (×2): qty 2.5

## 2015-06-19 MED ORDER — SODIUM CHLORIDE 0.9 % IV SOLN
INTRAVENOUS | Status: DC
  Administered 2015-06-19: 18:00:00 via INTRAVENOUS

## 2015-06-19 MED ORDER — METHOCARBAMOL 500 MG PO TABS
500.0000 mg | ORAL_TABLET | Freq: Three times a day (TID) | ORAL | Status: AC | PRN
  Administered 2015-06-20: 500 mg via ORAL
  Filled 2015-06-19 (×2): qty 1

## 2015-06-19 MED ORDER — LOPERAMIDE HCL 2 MG PO CAPS: 2.0000 mg | ORAL_CAPSULE | ORAL | Status: AC | PRN

## 2015-06-19 MED ORDER — FENTANYL CITRATE (PF) 250 MCG/5ML IJ SOLN
INTRAMUSCULAR | Status: AC
  Filled 2015-06-19: qty 5

## 2015-06-19 MED ORDER — DICYCLOMINE HCL 20 MG PO TABS
20.0000 mg | ORAL_TABLET | Freq: Four times a day (QID) | ORAL | Status: AC | PRN
  Filled 2015-06-19: qty 1

## 2015-06-19 MED ORDER — HYDROMORPHONE HCL 1 MG/ML IJ SOLN
0.5000 mg | INTRAMUSCULAR | Status: DC | PRN
  Administered 2015-06-19 – 2015-06-24 (×29): 1 mg via INTRAVENOUS
  Filled 2015-06-19 (×29): qty 1

## 2015-06-19 NOTE — Progress Notes (Signed)
[facility] PROGRESS NOTE  Bryce Hughes WUX:Bryce Hughes 1991-06-01 DOA: 06/18/2015 PCP: No primary care provider on file.  Assessment/Plan: #1 sepsis secondary to right antecubital fossa cellulitis Secondary to IVDU. Patient denies any chest or shortness of breath. Slight improvement with cellulitis with decreased erythema. MRI of the elbow with a 2 x 0.7 x 2.1 cm fluid collection in the subcutaneous fat anteriorly at the level of the antecubital fossa consistent with abscess. Smaller fluid collections extend deeper but remains superficial to the antecubital vasculature. Wound site is draining. Wound cultures are pending. Blood cultures are pending. WBC trending down. Check a 2-D echo to rule out endocarditis. Continue empiric IV vancomycin and change IV Zosyn to IV Unasyn. Consult with ID for further evaluation and management. Orthopedics following.  #2 right antecubital fossa abscess/cellulitis right antecubital fossa Secondary to IV drug use. MRI of the elbow with a 2 x 0.7 x 2.1 cm fluid collection in the subcutaneous fat anteriorly at the level of the antecubital fossa consistent with abscess. Smaller fluid collections extend deeper but remains superficial to the antecubital vasculature. Wound site is draining. Wound cultures are pending. Blood cultures are pending. Check a 2-D echo to rule out endocarditis. Continue empiric IV vancomycin and change IV Zosyn to IV Unasyn. Consult with ID for further evaluation and management. Orthopedics following.  #3 hypokalemia Replete.  #4 polysubstance abuse/IVDU Patient states last use was 2 weeks ago. UDS was positive for opiates and benzos. Patient admonished on polysubstance use. Social work Administrator, sports. Will place on the clonidine withdrawal protocol.  #5 alcohol abuse Continue the Ativan withdrawal protocol. Continue folic acid. Thiamine.  #6 tobacco abuse Tobacco cessation. Continue nicotine patch.  #7 asthma versus COPD with  acute exacerbation Check a chest x-ray. Will give a dose of IV Solu-Medrol. Place on nebulizer treatments. Patient on antibiotics. Tessalon Perles.  #8 prophylaxis SCDs for DVT prophylaxis.    Code Status: Full Family Communication: Updated patient, father, family at bedside. Disposition Plan: Remain in the step down unit.   Consultants: Orthopedics: Dr. Roda Shutters 06/18/2015 ID consultation pending  Procedures:  MRI right elbow 06/19/2015  Plain films of the orbits 06/19/2015  Antibiotics:  IV vancomycin 06/19/2015  IV Zosyn 06/19/2015>>>> 06/19/2015    IV Unasyn 06/19/2015  HPI/Subjective: Patient denies any chest pain. No shortness of breath. Patient states he had quit either relapsed with IV drug use of Opana 2 weeks prior to admission.  Objective: Filed Vitals:   06/19/15 0748  BP: 118/60  Pulse: 92  Temp:   Resp: 21   No intake or output data in the 24 hours ending 06/19/15 0853 Filed Weights   06/18/15 1900 06/18/15 1957 06/19/15 0005  Weight: 79.379 kg (175 lb) 79.379 kg (175 lb) 78.1 kg (172 lb 2.9 oz)    Exam:   General:  NAD. Slight tremors  Cardiovascular: Tachycardic , no murmurs rubs or gallops.   Respiratory: Expiratory wheezing.   Abdomen: Soft, nontender, nondistended, positive bowel sounds   Musculoskeletal:  no clubbing cyanosis or edema. Left upper extremity antecubital fossa with some decreased erythema, wound site is draining sero-sanguinous exudate, warmth, tenderness to palpation.   Data Reviewed: Basic Metabolic Panel:  Recent Labs Lab 06/18/15 1934 06/19/15 0122  NA 131* 133*  K 3.8 3.3*  CL 96* 100*  CO2 21* 25  GLUCOSE 103* 115*  BUN 5* 6  CREATININE 0.73 0.75  CALCIUM 9.1 8.6*   Liver Function Tests:  Recent Labs Lab 06/18/15 1934  AST 19  ALT 12*  ALKPHOS 80  BILITOT 0.9  PROT 7.5  ALBUMIN 3.7   No results for input(s): LIPASE, AMYLASE in the last 168 hours. No results for input(s): AMMONIA in the last 168  hours. CBC:  Recent Labs Lab 06/18/15 1934 06/19/15 0122  WBC 19.5* 14.1*  NEUTROABS 15.3*  --   HGB 16.3 14.0  HCT 45.2 39.7  MCV 95.2 94.5  PLT 139* 132*   Cardiac Enzymes: No results for input(s): CKTOTAL, CKMB, CKMBINDEX, TROPONINI in the last 168 hours. BNP (last 3 results) No results for input(s): BNP in the last 8760 hours.  ProBNP (last 3 results) No results for input(s): PROBNP in the last 8760 hours.  CBG: No results for input(s): GLUCAP in the last 168 hours.  Recent Results (from the past 240 hour(s))  MRSA PCR Screening     Status: None   Collection Time: 06/19/15 12:25 AM  Result Value Ref Range Status   MRSA by PCR NEGATIVE NEGATIVE Final    Comment:        The GeneXpert MRSA Assay (FDA approved for NASAL specimens only), is one component of a comprehensive MRSA colonization surveillance program. It is not intended to diagnose MRSA infection nor to guide or monitor treatment for MRSA infections.      Studies: Dg Eye Foreign Body  06/19/2015   CLINICAL DATA:  Metal working/exposure; clearance prior to MRI  EXAM: ORBITS FOR FOREIGN BODY - 2 VIEW  COMPARISON:  None.  FINDINGS: There is no evidence of metallic foreign body within the orbits. No significant bone abnormality identified.  IMPRESSION: No evidence of metallic foreign body within the orbits.   Electronically Signed   By: Lupita Raider, M.D.   On: 06/19/2015 07:02   Dg Elbow Complete Right  06/18/2015   CLINICAL DATA:  Right elbow abscess.  Infection for 1 week.  EXAM: RIGHT ELBOW - COMPLETE 3+ VIEW  COMPARISON:  None.  FINDINGS: Extensive soft tissue swelling is present within the antecubital fossa. There is gas within the soft tissue as well along the medial aspect of the elbow. The elbow is intact.  IMPRESSION: 1. Soft tissue swelling gas along the medial aspect of the left elbow concerning for infection and developing abscess.   Electronically Signed   By: Marin Roberts M.D.   On:  06/18/2015 23:05   Mr Elbow Right W Wo Contrast  06/19/2015   CLINICAL DATA:  24 y.o. male who presents with right elbow pain and swelling for a couple of days. Has had fever of 101. Uses IV opana and last use was 2 weeks ago. Also an alcoholic and drinks 1 fifth of vodka a day. Patient overdosed a few months ago and needed CPR. Pain is worse with any movement of the elbow. Does not radiate. Pain is severe.  EXAM: MRI OF THE RIGHT ELBOW WITHOUT AND WITH CONTRAST  TECHNIQUE: Multiplanar, multisequence MR imaging of the elbow was performed before and after the administration of intravenous contrast.  CONTRAST:  15mL MULTIHANCE GADOBENATE DIMEGLUMINE 529 MG/ML IV SOLN  COMPARISON:  None.  FINDINGS: TENDONS  Common forearm flexor origin: Intact.  Common forearm extensor origin: Intact.  Biceps: Intact.  Triceps: Intact.  LIGAMENTS  Medial stabilizers: Intact.  Lateral stabilizers:  Intact.  Cartilage: No chondral defect.  Joint: No joint effusion.  No synovitis.  Cubital tunnel: Normal cubital tunnel.  Bones: No marrow signal abnormality. No fracture or dislocation. No bone destruction or periosteal reaction.  Soft tissue: There  is circumferential soft tissue edema within the subcutaneous fat around the elbow joint most severe anteriorly. There is a 2 x 0.7 x 2.1 cm fluid collection in the subcutaneous fat anteriorly at the level of the antecubital fossa consistent with an abscess. There are smaller fluid collections which extend deeper the remain superficial to the antecubital vasculature. There is mild edema in the anterior peripheral brachioradialis and brachialis muscles without enhancement likely reflecting reactive changes secondary to adjacent inflammation.  IMPRESSION: 1. Cellulitis surrounding the right elbow bow severe anteriorly. There is a 2 x 0.7 x 2.1 cm fluid collection in the subcutaneous fat anteriorly at the level of the antecubital fossa consistent with an abscess. There are smaller fluid  collections which extend deeper the remain superficial to the antecubital vasculature.   Electronically Signed   By: Elige Ko   On: 06/19/2015 08:38   Dg Chest Port 1 View  06/18/2015   CLINICAL DATA:  Fever, cough, wheezing, right arm infection  EXAM: PORTABLE CHEST - 1 VIEW  COMPARISON:  04/17/2015  FINDINGS: Lungs are clear.  No pleural effusion or pneumothorax.  The heart is normal in size.  IMPRESSION: No evidence of acute cardiopulmonary disease.   Electronically Signed   By: Charline Bills M.D.   On: 06/18/2015 19:36    Scheduled Meds: . folic acid  1 mg Intravenous Daily  . LORazepam  0-4 mg Oral 4 times per day   Followed by  . [START ON 06/20/2015] LORazepam  0-4 mg Oral Q12H  . multivitamin with minerals  1 tablet Oral Daily  . nicotine  21 mg Transdermal Daily  . piperacillin-tazobactam (ZOSYN)  IV  3.375 g Intravenous 3 times per day  . potassium chloride  40 mEq Oral Once  . thiamine  100 mg Intravenous Daily  . vancomycin  1,000 mg Intravenous Q8H   Continuous Infusions: . sodium chloride 100 mL/hr at 06/19/15 0100    Principal Problem:   Sepsis Active Problems:   Abscess of antecubital fossa: RUE   Asthma with acute exacerbation   Polysubstance abuse   Alcohol abuse   Cellulitis of arm, left   IVDU (intravenous drug user)   Hypokalemia    Time spent: 40 minutes    Abran Gavigan M.D. [facility] Pager [phone]. If 7PM-7AM, please contact night-coverage at [email or web address], password [facility] 06/19/2015, 8:53 AM  LOS: 1 day

## 2015-06-19 NOTE — Progress Notes (Signed)
Spoke with Darrin Nipper about pt transporting without the nurse. Benedetto Coons gives order that it is ok for the pt to transport to MRI without the nurse.

## 2015-06-19 NOTE — Anesthesia Preprocedure Evaluation (Addendum)
Anesthesia Evaluation  Patient identified by MRN, date of birth, ID band Patient awake    Reviewed: Allergy & Precautions, NPO status , Patient's Chart, lab work & pertinent test results  Airway Mallampati: II  TM Distance: >3 FB Neck ROM: Full    Dental no notable dental hx. (+) Dental Advidsory Given, Poor Dentition   Pulmonary COPDCurrent Smoker,  breath sounds clear to auscultation  Pulmonary exam normal       Cardiovascular negative cardio ROS Normal cardiovascular examRhythm:Regular Rate:Normal     Neuro/Psych negative neurological ROS  negative psych ROS   GI/Hepatic negative GI ROS, (+)     substance abuse  alcohol use and IV drug use,   Endo/Other  negative endocrine ROS  Renal/GU negative Renal ROS  negative genitourinary   Musculoskeletal negative musculoskeletal ROS (+)   Abdominal   Peds negative pediatric ROS (+)  Hematology negative hematology ROS (+)   Anesthesia Other Findings   Reproductive/Obstetrics negative OB ROS                           Anesthesia Physical Anesthesia Plan  ASA: III and emergent  Anesthesia Plan: General   Post-op Pain Management:    Induction: Intravenous  Airway Management Planned: Oral ETT and LMA  Additional Equipment:   Intra-op Plan:   Post-operative Plan: Extubation in OR  Informed Consent: I have reviewed the patients History and Physical, chart, labs and discussed the procedure including the risks, benefits and alternatives for the proposed anesthesia with the patient or authorized representative who has indicated his/her understanding and acceptance.   Dental advisory given and Dental Advisory Given  Plan Discussed with: CRNA, Surgeon and Anesthesiologist  Anesthesia Plan Comments:        Anesthesia Quick Evaluation
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
## 2015-06-19 NOTE — Progress Notes (Signed)
   Subjective:  Patient reports pain as improved.  Objective:   VITALS:   Filed Vitals:   06/19/15 0005 06/19/15 0015 06/19/15 0400 06/19/15 0748  BP: 110/52 107/57  118/60  Pulse: 112 113  92  Temp: 98.8 F (37.1 C)  99.1 F (37.3 C)   TempSrc:   Oral   Resp: Height:  (1.803 m)     Weight: 78.1 kg (172 lb 2.9 oz)     SpO2: 98% 96%  98%    Cellulitis is slightly improved Elbow wound continues to drain adequately Not septic   Lab Results  Component Value Date   WBC 14.1* 06/19/2015   HGB 14.0 06/19/2015   HCT 39.7 06/19/2015   MCV 94.5 06/19/2015   PLT 132* 06/19/2015     Assessment/Plan:   - WBCs trending down - clinically slightly improved - MRI shows no deep abscesses, only 2 very small subcutaneous abscesses - will reevaluate in the am and if not significantly better, will need formal I&D - agree with ID consult - cultures pending - continue v/z empirically - polysubstance abuse per primary team  Cheral Almas 06/19/2015, 9:00 AM [phone]

## 2015-06-19 NOTE — Progress Notes (Signed)
UR Completed. Tanika Bracco, RN, BSN.  [phone] 

## 2015-06-19 NOTE — Progress Notes (Signed)
Received pt from the ED. Pt a&o x4 and ambulatory. No no complaints at this time.

## 2015-06-19 NOTE — Progress Notes (Signed)
OR called for the pt to come down for procedure. OR notified that pt was currently in MRI. Call [phone] when pt arrives back on the floor.

## 2015-06-19 NOTE — Progress Notes (Signed)
Patient BBS clear, no distress noted. Able to speak in full sentences. Takes inhaler at home prn. SPO2 97 on room air. RT made patient aware that if he needed a treatment to please call if SOB or wheezing.

## 2015-06-19 NOTE — Progress Notes (Signed)
ANTIBIOTIC CONSULT NOTE - Follow Up  Pharmacy Consult for Vancomycin and Zosyn >>> Switch to Unasyn and Vancomycin Indication: cellulitis/abscess  No Known Allergies  Patient Measurements: Height:  (180.3 cm) Weight: 172 lb 2.9 oz (78.1 kg) IBW/kg (Calculated) : 75.3  Vital Signs: Temp: 98.8 F (37.1 C) (08/13 0917) Temp Source: Oral (08/13 0917) BP: 115/71 mmHg (08/13 0917) Pulse Rate: 89 (08/13 0917)  Labs:  Recent Labs  06/18/15 1934 06/19/15 0122  WBC 19.5* 14.1*  HGB 16.3 14.0  PLT 139* 132*  CREATININE 0.73 0.75   Estimated Creatinine Clearance: 153 mL/min (by C-G formula based on Cr of 0.75).  Assessment:  24 yr old male with hx IV drug use presented with significant cellulitis of right antecubital area with abscess.  Vanc 1 gram IV and Zosyn 3.375 gm IV given in ED and continued without noted complications.  WBC 19.5>>14.1 and he remains afebrile this morning.  Good renal function.  Blood and urine cultures sent and are pending.  He had a negative PCR for MRSA  Goal of Therapy:  Vancomycin trough level 15-20 mcg/ml appropriate Zosyn dose for renal function and infection  Plan:   Continue Vancomycin with 1 gram IV q8hrs.  Change Zosyn to Unasyn 3gm IV q6hrs   Will follow renal function, culture data, progress.   Nadara Mustard, PharmD., MS Clinical Pharmacist Pager:  [phone] Thank you for allowing pharmacy to be part of this patients care team. 06/19/2015,9:48 AM

## 2015-06-20 ENCOUNTER — Encounter (HOSPITAL_COMMUNITY)
Admission: EM | Disposition: A | Discharge status: Home / Self Care | Payer: Self-pay | Source: Home / Self Care | Attending: Internal Medicine

## 2015-06-20 ENCOUNTER — Inpatient Hospital Stay (HOSPITAL_COMMUNITY): Payer: Self-pay | Admitting: Anesthesiology

## 2015-06-20 ENCOUNTER — Inpatient Hospital Stay (HOSPITAL_COMMUNITY): Payer: MEDICAID | Admitting: Anesthesiology

## 2015-06-20 DIAGNOSIS — F10239 Alcohol dependence with withdrawal, unspecified: Secondary | ICD-10-CM | POA: Insufficient documentation

## 2015-06-20 DIAGNOSIS — F10939 Alcohol use, unspecified with withdrawal, unspecified: Secondary | ICD-10-CM | POA: Insufficient documentation

## 2015-06-20 DIAGNOSIS — B954 Other streptococcus as the cause of diseases classified elsewhere: Secondary | ICD-10-CM

## 2015-06-20 DIAGNOSIS — R410 Disorientation, unspecified: Secondary | ICD-10-CM

## 2015-06-20 HISTORY — PX: I & D EXTREMITY: SHX5045

## 2015-06-20 LAB — CBC WITH DIFFERENTIAL/PLATELET
Basophils Absolute: 0 K/uL (ref 0.0–0.1)
Basophils Relative: 0 % (ref 0–1)
Eosinophils Absolute: 0 K/uL (ref 0.0–0.7)
Eosinophils Relative: 0 % (ref 0–5)
HCT: 37.9 % — ABNORMAL LOW (ref 39.0–52.0)
Hemoglobin: 13 g/dL (ref 13.0–17.0)
Lymphocytes Relative: 4 % — ABNORMAL LOW (ref 12–46)
Lymphs Abs: 0.5 K/uL — ABNORMAL LOW (ref 0.7–4.0)
MCH: 32.8 pg (ref 26.0–34.0)
MCHC: 34.3 g/dL (ref 30.0–36.0)
MCV: 95.7 fL (ref 78.0–100.0)
Monocytes Absolute: 0.3 K/uL (ref 0.1–1.0)
Monocytes Relative: 3 % (ref 3–12)
Neutro Abs: 10 K/uL — ABNORMAL HIGH (ref 1.7–7.7)
Neutrophils Relative %: 93 % — ABNORMAL HIGH (ref 43–77)
Platelets: 157 K/uL (ref 150–400)
RBC: 3.96 MIL/uL — ABNORMAL LOW (ref 4.22–5.81)
RDW: 12.8 % (ref 11.5–15.5)
WBC: 10.7 K/uL — ABNORMAL HIGH (ref 4.0–10.5)

## 2015-06-20 LAB — COMPREHENSIVE METABOLIC PANEL WITH GFR
ALT: 13 U/L — ABNORMAL LOW (ref 17–63)
AST: 26 U/L (ref 15–41)
Albumin: 3.2 g/dL — ABNORMAL LOW (ref 3.5–5.0)
Alkaline Phosphatase: 65 U/L (ref 38–126)
Anion gap: 10 (ref 5–15)
BUN: <5 mg/dL — ABNORMAL LOW (ref 6–20)
CO2: 22 mmol/L (ref 22–32)
Calcium: 8.8 mg/dL — ABNORMAL LOW (ref 8.9–10.3)
Chloride: 104 mmol/L (ref 101–111)
Creatinine, Ser: 0.81 mg/dL (ref 0.61–1.24)
GFR calc Af Amer: >60 mL/min
GFR calc non Af Amer: >60 mL/min
Glucose, Bld: 136 mg/dL — ABNORMAL HIGH (ref 65–99)
Potassium: 4 mmol/L (ref 3.5–5.1)
Sodium: 136 mmol/L (ref 135–145)
Total Bilirubin: 0.5 mg/dL (ref 0.3–1.2)
Total Protein: 6.9 g/dL (ref 6.5–8.1)

## 2015-06-20 LAB — MAGNESIUM: Magnesium: 2.1 mg/dL (ref 1.7–2.4)

## 2015-06-20 LAB — URINE CULTURE: Culture: 4000

## 2015-06-20 SURGERY — IRRIGATION AND DEBRIDEMENT EXTREMITY
Anesthesia: General | Site: Arm Upper | Laterality: Right

## 2015-06-20 MED ORDER — ONDANSETRON HCL 4 MG/2ML IJ SOLN
INTRAMUSCULAR | Status: DC | PRN
  Administered 2015-06-20: 4 mg via INTRAVENOUS

## 2015-06-20 MED ORDER — METHYLPREDNISOLONE SODIUM SUCC 125 MG IJ SOLR
60.0000 mg | Freq: Two times a day (BID) | INTRAMUSCULAR | Status: DC
  Administered 2015-06-20 – 2015-06-21 (×2): 60 mg via INTRAVENOUS
  Filled 2015-06-20 (×2): qty 0.96

## 2015-06-20 MED ORDER — PROPOFOL 10 MG/ML IV BOLUS
INTRAVENOUS | Status: DC | PRN
  Administered 2015-06-20: 200 mg via INTRAVENOUS

## 2015-06-20 MED ORDER — LORAZEPAM 2 MG/ML IJ SOLN: 0.0000 mg | Freq: Four times a day (QID) | INTRAMUSCULAR | Status: DC

## 2015-06-20 MED ORDER — LORAZEPAM 2 MG/ML IJ SOLN
0.0000 mg | Freq: Two times a day (BID) | INTRAMUSCULAR | Status: DC
Start: 2015-06-22 — End: 2015-06-20

## 2015-06-20 MED ORDER — VITAMIN B-1 100 MG PO TABS
100.0000 mg | ORAL_TABLET | Freq: Every day | ORAL | Status: DC
  Administered 2015-06-21 – 2015-06-24 (×4): 100 mg via ORAL
  Filled 2015-06-20 (×5): qty 1

## 2015-06-20 MED ORDER — LORAZEPAM 1 MG PO TABS: 1.0000 mg | ORAL_TABLET | Freq: Four times a day (QID) | ORAL | Status: AC | PRN

## 2015-06-20 MED ORDER — LACTATED RINGERS IV SOLN
INTRAVENOUS | Status: DC | PRN
  Administered 2015-06-20: 07:00:00 via INTRAVENOUS

## 2015-06-20 MED ORDER — MIDAZOLAM HCL 2 MG/2ML IJ SOLN
INTRAMUSCULAR | Status: AC
  Filled 2015-06-20: qty 4

## 2015-06-20 MED ORDER — FENTANYL CITRATE (PF) 250 MCG/5ML IJ SOLN
INTRAMUSCULAR | Status: AC
  Filled 2015-06-20: qty 5

## 2015-06-20 MED ORDER — LORAZEPAM 2 MG/ML IJ SOLN: 1.0000 mg | Freq: Four times a day (QID) | INTRAMUSCULAR | Status: AC | PRN

## 2015-06-20 MED ORDER — FOLIC ACID 1 MG PO TABS
1.0000 mg | ORAL_TABLET | Freq: Every day | ORAL | Status: DC
  Administered 2015-06-21 – 2015-06-24 (×4): 1 mg via ORAL
  Filled 2015-06-20 (×5): qty 1

## 2015-06-20 MED ORDER — MIDAZOLAM HCL 5 MG/5ML IJ SOLN
INTRAMUSCULAR | Status: DC | PRN
  Administered 2015-06-20: 2 mg via INTRAVENOUS

## 2015-06-20 MED ORDER — FENTANYL CITRATE (PF) 100 MCG/2ML IJ SOLN
INTRAMUSCULAR | Status: DC | PRN
  Administered 2015-06-20 (×5): 50 ug via INTRAVENOUS

## 2015-06-20 MED ORDER — LIDOCAINE HCL (CARDIAC) 20 MG/ML IV SOLN
INTRAVENOUS | Status: DC | PRN
  Administered 2015-06-20: 50 mg via INTRAVENOUS

## 2015-06-20 MED ORDER — SODIUM CHLORIDE 0.9 % IR SOLN
Status: DC | PRN
  Administered 2015-06-20: 3000 mL
  Administered 2015-06-20: 1000 mL
  Administered 2015-06-20: 3000 mL

## 2015-06-20 MED ORDER — PROMETHAZINE HCL 25 MG/ML IJ SOLN: 6.2500 mg | INTRAMUSCULAR | Status: DC | PRN

## 2015-06-20 MED ORDER — HYDROMORPHONE HCL 1 MG/ML IJ SOLN: 0.2500 mg | INTRAMUSCULAR | Status: DC | PRN

## 2015-06-20 SURGICAL SUPPLY — 69 items
BANDAGE ELASTIC 3 VELCRO ST LF (GAUZE/BANDAGES/DRESSINGS) ×2 IMPLANT
BANDAGE ELASTIC 4 VELCRO ST LF (GAUZE/BANDAGES/DRESSINGS) ×2 IMPLANT
BLADE SURG 10 STRL SS (BLADE) ×3 IMPLANT
BNDG COHESIVE 1X5 TAN STRL LF (GAUZE/BANDAGES/DRESSINGS) IMPLANT
BNDG COHESIVE 4X5 TAN STRL (GAUZE/BANDAGES/DRESSINGS) ×3 IMPLANT
BNDG COHESIVE 6X5 TAN STRL LF (GAUZE/BANDAGES/DRESSINGS) ×6 IMPLANT
BNDG CONFORM 3 STRL LF (GAUZE/BANDAGES/DRESSINGS) IMPLANT
BNDG GAUZE ELAST 4 BULKY (GAUZE/BANDAGES/DRESSINGS) ×2 IMPLANT
BNDG GAUZE STRTCH 6 (GAUZE/BANDAGES/DRESSINGS) ×9 IMPLANT
CORDS BIPOLAR (ELECTRODE) IMPLANT
COVER SURGICAL LIGHT HANDLE (MISCELLANEOUS) ×3 IMPLANT
CUFF TOURNIQUET SINGLE 24IN (TOURNIQUET CUFF) IMPLANT
CUFF TOURNIQUET SINGLE 34IN LL (TOURNIQUET CUFF) ×6 IMPLANT
CUFF TOURNIQUET SINGLE 44IN (TOURNIQUET CUFF) IMPLANT
DRAIN PENROSE 1/4X12 LTX STRL (WOUND CARE) ×2 IMPLANT
DRAPE EXTREMITY BILATERAL (DRAPE) IMPLANT
DRAPE IMP U-DRAPE 54X76 (DRAPES) IMPLANT
DRAPE INCISE IOBAN 66X45 STRL (DRAPES) ×12 IMPLANT
DRAPE SURG 17X23 STRL (DRAPES) IMPLANT
DRAPE U-SHAPE 47X51 STRL (DRAPES) ×3 IMPLANT
DRSG PAD ABDOMINAL 8X10 ST (GAUZE/BANDAGES/DRESSINGS) ×2 IMPLANT
DURAPREP 26ML APPLICATOR (WOUND CARE) ×3 IMPLANT
ELECT CAUTERY BLADE 6.4 (BLADE) ×3 IMPLANT
ELECT REM PT RETURN 9FT ADLT (ELECTROSURGICAL)
ELECTRODE REM PT RTRN 9FT ADLT (ELECTROSURGICAL) IMPLANT
FACESHIELD WRAPAROUND (MASK) IMPLANT
FACESHIELD WRAPAROUND OR TEAM (MASK) IMPLANT
GAUZE SPONGE 4X4 12PLY STRL (GAUZE/BANDAGES/DRESSINGS) ×6 IMPLANT
GAUZE XEROFORM 1X8 LF (GAUZE/BANDAGES/DRESSINGS) ×3 IMPLANT
GAUZE XEROFORM 5X9 LF (GAUZE/BANDAGES/DRESSINGS) ×3 IMPLANT
GLOVE NEODERM STRL 7.5 LF PF (GLOVE) ×2 IMPLANT
GLOVE SURG NEODERM 7.5  LF PF (GLOVE) ×4
GOWN STRL REIN XL XLG (GOWN DISPOSABLE) ×6 IMPLANT
HANDPIECE INTERPULSE COAX TIP (DISPOSABLE)
KIT BASIN OR (CUSTOM PROCEDURE TRAY) ×3 IMPLANT
KIT ROOM TURNOVER OR (KITS) ×3 IMPLANT
MANIFOLD NEPTUNE II (INSTRUMENTS) ×3 IMPLANT
NS IRRIG 1000ML POUR BTL (IV SOLUTION) ×6 IMPLANT
PACK ORTHO EXTREMITY (CUSTOM PROCEDURE TRAY) ×3 IMPLANT
PAD ABD 8X10 STRL (GAUZE/BANDAGES/DRESSINGS) ×3 IMPLANT
PAD ARMBOARD 7.5X6 YLW CONV (MISCELLANEOUS) ×6 IMPLANT
PAD CAST 3X4 CTTN HI CHSV (CAST SUPPLIES) IMPLANT
PAD CAST 4YDX4 CTTN HI CHSV (CAST SUPPLIES) IMPLANT
PADDING CAST ABS 4INX4YD NS (CAST SUPPLIES) ×4
PADDING CAST ABS COTTON 4X4 ST (CAST SUPPLIES) ×2 IMPLANT
PADDING CAST COTTON 3X4 STRL (CAST SUPPLIES) ×3
PADDING CAST COTTON 4X4 STRL (CAST SUPPLIES) ×3
PADDING CAST COTTON 6X4 STRL (CAST SUPPLIES) ×3 IMPLANT
SET HNDPC FAN SPRY TIP SCT (DISPOSABLE) IMPLANT
SPLINT FIBERGLASS 4X30 (CAST SUPPLIES) ×2 IMPLANT
SPONGE GAUZE 4X4 12PLY STER LF (GAUZE/BANDAGES/DRESSINGS) ×2 IMPLANT
SPONGE LAP 18X18 X RAY DECT (DISPOSABLE) ×6 IMPLANT
STOCKINETTE IMPERVIOUS 9X36 MD (GAUZE/BANDAGES/DRESSINGS) ×3 IMPLANT
SUT ETHILON 2 0 FS 18 (SUTURE) ×9 IMPLANT
SUT ETHILON 2 0 PSLX (SUTURE) ×3 IMPLANT
SUT ETHILON 3 0 PS 1 (SUTURE) ×6 IMPLANT
SUT VIC AB 2-0 CT1 36 (SUTURE) ×3 IMPLANT
SUT VIC AB 2-0 FS1 27 (SUTURE) ×6 IMPLANT
SYR CONTROL 10ML LL (SYRINGE) IMPLANT
TOWEL OR 17X24 6PK STRL BLUE (TOWEL DISPOSABLE) ×3 IMPLANT
TOWEL OR 17X26 10 PK STRL BLUE (TOWEL DISPOSABLE) ×3 IMPLANT
TUBE ANAEROBIC SPECIMEN COL (MISCELLANEOUS) IMPLANT
TUBE CONNECTING 12'X1/4 (SUCTIONS) ×1
TUBE CONNECTING 12X1/4 (SUCTIONS) ×2 IMPLANT
TUBE FEEDING 5FR 15 INCH (TUBING) IMPLANT
TUBING CYSTO DISP (UROLOGICAL SUPPLIES) ×3 IMPLANT
UNDERPAD 30X30 INCONTINENT (UNDERPADS AND DIAPERS) ×6 IMPLANT
WATER STERILE IRR 1000ML POUR (IV SOLUTION) ×3 IMPLANT
YANKAUER SUCT BULB TIP NO VENT (SUCTIONS) ×3 IMPLANT

## 2015-06-20 NOTE — Anesthesia Postprocedure Evaluation (Signed)
  Anesthesia Post-op Note  Patient: Bryce Hughes  Procedure(s) Performed: Procedure(s) (LRB): IRRIGATION AND DEBRIDEMENT RIGHT ANTECUBITAL FOSSA WITH PLACEMENT OF DRAIN  (Right)  Patient Location: PACU  Anesthesia Type: General  Level of Consciousness: awake and alert   Airway and Oxygen Therapy: Patient Spontanous Breathing  Post-op Pain: mild  Post-op Assessment: Post-op Vital signs reviewed, Patient's Cardiovascular Status Stable, Respiratory Function Stable, Patent Airway and No signs of Nausea or vomiting  Last Vitals:  Filed Vitals:   06/20/15 0851  BP: 134/78  Pulse: 89  Temp:   Resp: 18    Post-op Vital Signs: stable   Complications: No apparent anesthesia complications

## 2015-06-20 NOTE — Discharge Instructions (Signed)
Postoperative instructions:  Weightbearing: as tolerated  Dressing instructions: Keep your dressing and/or splint clean and dry at all times.  It will be removed at your first post-operative appointment.  Your stitches and/or staples will be removed at this visit.  Incision instructions:  Do not soak your incision for 3 weeks after surgery.  If the incision gets wet, pat dry and do not scrub the incision.  Pain control:  You have been given a prescription to be taken as directed for post-operative pain control.  In addition, elevate the operative extremity above the heart at all times to prevent swelling and throbbing pain.  Take over-the-counter Colace,  by mouth twice a day while taking narcotic pain medications to help prevent constipation.  Follow up appointments: 1) 7-10 days for suture removal and wound check. 2) Dr. Roda Shutters as scheduled.   -------------------------------------------------------------------------------------------------------------  After Surgery Pain Control:  After your surgery, post-surgical discomfort or pain is likely. This discomfort can last several days to a few weeks. At certain times of the day your discomfort may be more intense.  Did you receive a nerve block?  A nerve block can provide pain relief for one hour to two days after your surgery. As long as the nerve block is working, you will experience little or no sensation in the area the surgeon operated on.  As the nerve block wears off, you will begin to experience pain or discomfort. It is very important that you begin taking your prescribed pain medication before the nerve block fully wears off. Treating your pain at the first sign of the block wearing off will ensure your pain is better controlled and more tolerable when full-sensation returns. Do not wait until the pain is intolerable, as the medicine will be less effective. It is better to treat pain in advance than to try and catch up.  General  Anesthesia:  If you did not receive a nerve block during your surgery, you will need to start taking your pain medication shortly after your surgery and should continue to do so as prescribed by your surgeon.  Pain Medication:  Most commonly we prescribe Vicodin and Percocet for post-operative pain. Both of these medications contain a combination of acetaminophen (Tylenol) and a narcotic to help control pain.   It takes between 30 and 45 minutes before pain medication starts to work. It is important to take your medication before your pain level gets too intense.   Nausea is a common side effect of many pain medications. You will want to eat something before taking your pain medicine to help prevent nausea.   If you are taking a prescription pain medication that contains acetaminophen, we recommend that you do not take additional over the counter acetaminophen (Tylenol).  Other pain relieving options:   Using a cold pack to ice the affected area a few times a day (15 to 20 minutes at a time) can help to relieve pain, reduce swelling and bruising.   Elevation of the affected area can also help to reduce pain and swelling.

## 2015-06-20 NOTE — Anesthesia Procedure Notes (Signed)
Procedure Name: LMA Insertion Date/Time: 06/20/2015 7:30 AM Performed by: Carmela Rima Pre-anesthesia Checklist: Timeout performed, Patient being monitored, Suction available, Emergency Drugs available and Patient identified Patient Re-evaluated:Patient Re-evaluated prior to inductionOxygen Delivery Method: Circle system utilized Preoxygenation: Pre-oxygenation with 100% oxygen Intubation Type: IV induction Ventilation: Mask ventilation without difficulty LMA: LMA inserted LMA Size: 4.0 Placement Confirmation: positive ETCO2 and breath sounds checked- equal and bilateral Dental Injury: Teeth and Oropharynx as per pre-operative assessment

## 2015-06-20 NOTE — Progress Notes (Signed)
Cellulitis is slightly improved, certainly no worse.  WBC improving.  However, continues to drain murky fluid.  Recommend formal I&D.  Patient understands r/b/a and wishes to proceed.    Mayra Reel, MD [facility] [phone] 6:45 AM

## 2015-06-20 NOTE — Transfer of Care (Signed)
Immediate Anesthesia Transfer of Care Note  Patient: Bryce Hughes  Procedure(s) Performed: Procedure(s): IRRIGATION AND DEBRIDEMENT RIGHT ANTECUBITAL FOSSA WITH PLACEMENT OF DRAIN  (Right)  Patient Location: PACU  Anesthesia Type:General  Level of Consciousness: sedated  Airway & Oxygen Therapy: Patient Spontanous Breathing and Patient connected to face mask oxygen  Post-op Assessment: Report given to RN, Post -op Vital signs reviewed and stable and Patient moving all extremities X 4  Post vital signs: Reviewed and stable  Last Vitals:  Filed Vitals:   06/20/15 0353  BP: 127/71  Pulse: 84  Temp: 36.3 C  Resp: 16    Complications: No apparent anesthesia complications

## 2015-06-20 NOTE — H&P (Signed)
H&P update  The surgical history has been reviewed and remains accurate without interval change.  The patient was re-examined and patient's physiologic condition has not changed significantly in the last 30 days. The condition still exists that makes this procedure necessary. The treatment plan remains the same, without new options for care.  No new pharmacological allergies or types of therapy has been initiated that would change the plan or the appropriateness of the plan.  The patient and/or family understand the potential benefits and risks.  Mayra Reel, MD 06/20/2015 6:46 AM

## 2015-06-20 NOTE — Op Note (Signed)
   Date of surgery: 06/20/2015  Preoperative diagnosis: Right antecubital fossa deep abscesses  Postoperative diagnosis: Same  Procedure: 1. Incision and drainage of right antecubital fossa multiple abscesses, complicated 2. Adjacent tissue rearrangement right elbow 3 x 2 cm  Surgeon: Glee Arvin, M.D.  Anesthesia: General  Estimated blood loss: Minimal  Specimens: One wound culture  Complications: None  Indications for procedure: Bryce Hughes is a 24 year old gentleman who presents today for surgical treatment of his multiple abscesses from IV drug use. The patient failed to respond appropriately to bedside incision and drainage and IV antibiotics.  Therefore surgical treatment was recommended and he was aware of the risks, benefits, and alternatives to surgery he wished to proceed.  Consent was obtained.  Description of procedure: The patient was identified in the preoperative holding area. The operative site was marked by the surgeon confirmed with the patient. He is brought back to the operating room. His placed supine on table. General anesthesia was administered by the anesthesiologist. A timeout was performed. The right upper extremity was prepped and draped in standard sterile fashion. A sterile tourniquet was placed on the upper right arm. Preoperative antibiotics were given. The tourniquet was inflated to 250 mmHg. A lazy S incision was made in the antecubital fossa.  Blunt dissection was performed in order to decompress the multiple areas of the elbow abscesses. Care was taken to preserve the LABC nerve. The veins were also preserved. Sharp excisional debridement and decompression of the abscesses were performed with Metzenbaum scissors and rongeur. Once adequate debridement had been performed I irrigated the wound thoroughly with 6 L of normal saline. I then performed adjacent tissue rearrangement in order to close the wound.  A quarter-inch Penrose was placed in the wound bed prior to  closure of the wound. Sterile dressings were applied. Patient was placed in a long-arm splint for soft tissue rest. Patient tolerated the procedure well was x-rayed and transferred to the PACU in stable condition. All sponge counts were correct.  Postoperative plan: Patient will be weight bear as tolerated to the right upper extremity. We will follow his clinical exam for improvement. He will remain on antibiotics for approximately 6 weeks or per the infectious disease team.

## 2015-06-20 NOTE — Progress Notes (Signed)
[facility] PROGRESS NOTE  Bryce Hughes ZOX:[id number] DOB: November 26, 1990 DOA: 06/18/2015 PCP: No primary care provider on file.  Assessment/Plan: #1 sepsis secondary to right antecubital fossa cellulitis Secondary to IVDU. Patient denies any chest or shortness of breath. Slight improvement with cellulitis with decreased erythema. MRI of the elbow with a 2 x 0.7 x 2.1 cm fluid collection in the subcutaneous fat anteriorly at the level of the antecubital fossa consistent with abscess. Smaller fluid collections extend deeper but remains superficial to the antecubital vasculature. Wound site is draining. Wound cultures are pending. Blood cultures are pending. WBC trending down. 2-D echo pending to rule out endocarditis.Patient status post incision and drainage of right antecubital fossa multiple abscesses, complicated, adjacent tissue rearrangement right elbow 3 x 2 cm per Dr. Roda Shutters 06/20/2015. Continue empiric IV vancomycin and IV Unasyn. ID following. Orthopedics following.  #2 right antecubital fossa abscess/cellulitis right antecubital fossa Secondary to IV drug use. MRI of the elbow with a 2 x 0.7 x 2.1 cm fluid collection in the subcutaneous fat anteriorly at the level of the antecubital fossa consistent with abscess. Smaller fluid collections extend deeper but remains superficial to the antecubital vasculature. Wound site is draining. Wound cultures are pending. Blood cultures are pending. 2-D echo pending to rule out endocarditis. Patient status post incision and drainage of right antecubital fossa multiple abscesses, complicated, adjacent tissue rearrangement right elbow 3 x 2 cm per Dr. Roda Shutters 06/20/2015. Continue empiric IV vancomycin and IV Unasyn. ID following. Orthopedics following.  #3 hypokalemia Repleted.  #4 polysubstance abuse/IVDU Patient states last use was 2 weeks ago. UDS was positive for opiates and benzos. Patient admonished on polysubstance use. Social work Administrator, sports.  Continue clonidine withdrawal protocol.  #5 alcohol abuse Continue the Ativan withdrawal protocol. Continue folic acid. Thiamine.  #6 tobacco abuse Tobacco cessation. Continue nicotine patch.  #7 asthma versus COPD with acute exacerbation Clinical improvement. Decrease IV Solu-Medrol to every 12 hours. Continue nebulizer treatments, Tessalon Perles, empiric IV antibiotics.   #8 prophylaxis SCDs for DVT prophylaxis.    Code Status: Full Family Communication: Updated patient, father, family at bedside. Disposition Plan: Remain in the step down unit.   Consultants: Orthopedics: Dr. Roda Shutters 06/18/2015 ID consultation: Dr Daiva Eves 06/19/15  Procedures:  MRI right elbow 06/19/2015  Plain films of the orbits 06/19/2015 1. Incision and drainage of right antecubital fossa multiple abscesses, complicated 2. Adjacent tissue rearrangement right elbow 3 x 2 cm--Per Dr Roda Shutters 06/19/15   Antibiotics:  IV vancomycin 06/19/2015  IV Zosyn 06/19/2015>>>> 06/19/2015    IV Unasyn 06/19/2015  HPI/Subjective: Patient c/o RUE pain postop. Patient postop after I and D this morning. Patient denies CP. No SOB.patient asking to go outside to smoke a cigarette.   Objective: Filed Vitals:   06/20/15 0906  BP: 126/68  Pulse: 113  Temp:   Resp: 14    Intake/Output Summary (Last 24 hours) at 06/20/15 0953 Last data filed at 06/20/15 0809  Gross per 24 hour  Intake   3095 ml  Output   1753 ml  Net   1342 ml   Filed Weights   06/18/15 1900 06/18/15 1957 06/19/15 0005  Weight: 79.379 kg (175 lb) 79.379 kg (175 lb) 78.1 kg (172 lb 2.9 oz)    Exam:   General:  NAD.   Cardiovascular: Tachycardic , no murmurs rubs or gallops.   Respiratory: CTAB  Abdomen: Soft, nontender, nondistended, positive bowel sounds   Musculoskeletal:  no clubbing cyanosis or edema. Right upper extremity  bandaged postop.  Data Reviewed: Basic Metabolic Panel:  Recent Labs Lab 06/18/15 1934 06/19/15 0122  06/19/15 0900 06/20/15 0320  NA 131* 133*  --  136  K 3.8 3.3*  --  4.0  CL 96* 100*  --  104  CO2 21* 25  --  22  GLUCOSE 103* 115*  --  136*  BUN 5* 6  --  <5*  CREATININE 0.73 0.75  --  0.81  CALCIUM 9.1 8.6*  --  8.8*  MG  --   --  1.6* 2.1   Liver Function Tests:  Recent Labs Lab 06/18/15 1934 06/20/15 0320  AST 19 26  ALT 12* 13*  ALKPHOS 80 65  BILITOT 0.9 0.5  PROT 7.5 6.9  ALBUMIN 3.7 3.2*   No results for input(s): LIPASE, AMYLASE in the last 168 hours. No results for input(s): AMMONIA in the last 168 hours. CBC:  Recent Labs Lab 06/18/15 1934 06/19/15 0122 06/20/15 0320  WBC 19.5* 14.1* 10.7*  NEUTROABS 15.3*  --  10.0*  HGB 16.3 14.0 13.0  HCT 45.2 39.7 37.9*  MCV 95.2 94.5 95.7  PLT 139* 132* 157   Cardiac Enzymes: No results for input(s): CKTOTAL, CKMB, CKMBINDEX, TROPONINI in the last 168 hours. BNP (last 3 results) No results for input(s): BNP in the last 8760 hours.  ProBNP (last 3 results) No results for input(s): PROBNP in the last 8760 hours.  CBG: No results for input(s): GLUCAP in the last 168 hours.  Recent Results (from the past 240 hour(s))  Blood Culture (routine x 2)     Status: None (Preliminary result)   Collection Time: 06/18/15  7:34 PM  Result Value Ref Range Status   Specimen Description BLOOD LEFT ARM  Final   Special Requests BOTTLES DRAWN AEROBIC AND ANAEROBIC  Final   Culture NO GROWTH < 24 HOURS  Final   Report Status PENDING  Incomplete  Urine culture     Status: None (Preliminary result)   Collection Time: 06/18/15  7:46 PM  Result Value Ref Range Status   Specimen Description URINE, CLEAN CATCH  Final   Special Requests NONE  Final   Culture NO GROWTH < 24 HOURS  Final   Report Status PENDING  Incomplete  Blood Culture (routine x 2)     Status: None (Preliminary result)   Collection Time: 06/18/15  8:39 PM  Result Value Ref Range Status   Specimen Description BLOOD LEFT HAND  Final   Special Requests  BOTTLES DRAWN AEROBIC AND ANAEROBIC  Final   Culture NO GROWTH < 24 HOURS  Final   Report Status PENDING  Incomplete  MRSA PCR Screening     Status: None   Collection Time: 06/19/15 12:25 AM  Result Value Ref Range Status   MRSA by PCR NEGATIVE NEGATIVE Final    Comment:        The GeneXpert MRSA Assay (FDA approved for NASAL specimens only), is one component of a comprehensive MRSA colonization surveillance program. It is not intended to diagnose MRSA infection nor to guide or monitor treatment for MRSA infections.      Studies: Dg Eye Foreign Body  06/19/2015   CLINICAL DATA:  Metal working/exposure; clearance prior to MRI  EXAM: ORBITS FOR FOREIGN BODY - 2 VIEW  COMPARISON:  None.  FINDINGS: There is no evidence of metallic foreign body within the orbits. No significant bone abnormality identified.  IMPRESSION: No evidence of metallic foreign body within the orbits.  Electronically Signed   By: Lupita Raider, M.D.   On: 06/19/2015 07:02   Dg Elbow Complete Right  06/18/2015   CLINICAL DATA:  Right elbow abscess.  Infection for 1 week.  EXAM: RIGHT ELBOW - COMPLETE 3+ VIEW  COMPARISON:  None.  FINDINGS: Extensive soft tissue swelling is present within the antecubital fossa. There is gas within the soft tissue as well along the medial aspect of the elbow. The elbow is intact.  IMPRESSION: 1. Soft tissue swelling gas along the medial aspect of the left elbow concerning for infection and developing abscess.   Electronically Signed   By: Marin Roberts M.D.   On: 06/18/2015 23:05   Mr Elbow Right W Wo Contrast  06/19/2015   CLINICAL DATA:  24 y.o. male who presents with right elbow pain and swelling for a couple of days. Has had fever of 101. Uses IV opana and last use was 2 weeks ago. Also an alcoholic and drinks 1 fifth of vodka a day. Patient overdosed a few months ago and needed CPR. Pain is worse with any movement of the elbow. Does not radiate. Pain is severe.  EXAM: MRI  OF THE RIGHT ELBOW WITHOUT AND WITH CONTRAST  TECHNIQUE: Multiplanar, multisequence MR imaging of the elbow was performed before and after the administration of intravenous contrast.  CONTRAST:  15mL MULTIHANCE GADOBENATE DIMEGLUMINE 529 MG/ML IV SOLN  COMPARISON:  None.  FINDINGS: TENDONS  Common forearm flexor origin: Intact.  Common forearm extensor origin: Intact.  Biceps: Intact.  Triceps: Intact.  LIGAMENTS  Medial stabilizers: Intact.  Lateral stabilizers:  Intact.  Cartilage: No chondral defect.  Joint: No joint effusion.  No synovitis.  Cubital tunnel: Normal cubital tunnel.  Bones: No marrow signal abnormality. No fracture or dislocation. No bone destruction or periosteal reaction.  Soft tissue: There is circumferential soft tissue edema within the subcutaneous fat around the elbow joint most severe anteriorly. There is a 2 x 0.7 x 2.1 cm fluid collection in the subcutaneous fat anteriorly at the level of the antecubital fossa consistent with an abscess. There are smaller fluid collections which extend deeper the remain superficial to the antecubital vasculature. There is mild edema in the anterior peripheral brachioradialis and brachialis muscles without enhancement likely reflecting reactive changes secondary to adjacent inflammation.  IMPRESSION: 1. Cellulitis surrounding the right elbow bow severe anteriorly. There is a 2 x 0.7 x 2.1 cm fluid collection in the subcutaneous fat anteriorly at the level of the antecubital fossa consistent with an abscess. There are smaller fluid collections which extend deeper the remain superficial to the antecubital vasculature.   Electronically Signed   By: Elige Ko   On: 06/19/2015 08:38   Dg Chest Port 1 View  06/18/2015   CLINICAL DATA:  Fever, cough, wheezing, right arm infection  EXAM: PORTABLE CHEST - 1 VIEW  COMPARISON:  04/17/2015  FINDINGS: Lungs are clear.  No pleural effusion or pneumothorax.  The heart is normal in size.  IMPRESSION: No evidence of  acute cardiopulmonary disease.   Electronically Signed   By: Charline Bills M.D.   On: 06/18/2015 19:36    Scheduled Meds: . ampicillin-sulbactam (UNASYN) IV  3 g Intravenous Q6H  . benzonatate  200 mg Oral TID  . folic acid  1 mg Intravenous Daily  . LORazepam  0-4 mg Oral 4 times per day   Followed by  . LORazepam  0-4 mg Oral Q12H  . methylPREDNISolone (SOLU-MEDROL) injection  60 mg Intravenous  3 times per day  . multivitamin with minerals  1 tablet Oral Daily  . nicotine  21 mg Transdermal Daily  . thiamine  100 mg Intravenous Daily  . vancomycin  1,000 mg Intravenous Q8H   Continuous Infusions: . sodium chloride 125 mL/hr at 06/19/15 1900    Principal Problem:   Sepsis Active Problems:   Abscess of antecubital fossa: RUE   Asthma with acute exacerbation   Polysubstance abuse   Alcohol abuse   Cellulitis of arm, left   IVDU (intravenous drug user)   Hypokalemia   COPD exacerbation   H/O metal removed from eye   Blood poisoning    Time spent: 40 minutes    Drue Camera M.D. [facility] Pager [phone]. If 7PM-7AM, please contact night-coverage at [email or web address], password [facility] 06/20/2015, 9:53 AM  LOS: 2 days

## 2015-06-20 NOTE — Progress Notes (Signed)
[facility]    Subjective: Pt very sleep, apparently delirious overnight   Antibiotics:  Anti-infectives    Start     Dose/Rate Route Frequency Ordered Stop   06/19/15 1400  Ampicillin-Sulbactam (UNASYN) 3 g in sodium chloride 0.9 % 100 mL IVPB     3 g 100 mL/hr over 60 Minutes Intravenous Every 6 hours 06/19/15 0951     06/19/15 0600  piperacillin-tazobactam (ZOSYN) IVPB 3.375 g  Status:  Discontinued     3.375 g 12.5 mL/hr over 240 Minutes Intravenous 3 times per day 06/19/15 0206 06/19/15 0911   06/19/15 0600  vancomycin (VANCOCIN) IVPB 1000 mg/200 mL premix     1,000 mg 200 mL/hr over 60 Minutes Intravenous Every 8 hours 06/19/15 0206     06/18/15 1915  piperacillin-tazobactam (ZOSYN) IVPB 3.375 g     3.375 g 100 mL/hr over 30 Minutes Intravenous  Once 06/18/15 1906 06/18/15 2114   06/18/15 1915  vancomycin (VANCOCIN) IVPB 1000 mg/200 mL premix     1,000 mg 200 mL/hr over 60 Minutes Intravenous  Once 06/18/15 1906 06/18/15 2200      Medications: Scheduled Meds: . ampicillin-sulbactam (UNASYN) IV  3 g Intravenous Q6H  . benzonatate  200 mg Oral TID  . [START ON 06/21/2015] folic acid  1 mg Oral Daily  . LORazepam  0-4 mg Oral Q12H  . methylPREDNISolone (SOLU-MEDROL) injection  60 mg Intravenous Q12H  . multivitamin with minerals  1 tablet Oral Daily  . nicotine  21 mg Transdermal Daily  . [START ON 06/21/2015] thiamine  100 mg Oral Daily  . vancomycin  1,000 mg Intravenous Q8H   Continuous Infusions: . sodium chloride 75 mL/hr at 06/20/15 1008   PRN Meds:.acetaminophen **OR** acetaminophen, alum & mag hydroxide-simeth, dicyclomine, HYDROmorphone (DILAUDID) injection, hydrOXYzine, levalbuterol, loperamide, LORazepam, methocarbamol, naproxen, ondansetron **OR** ondansetron (ZOFRAN) IV, oxyCODONE    Objective: Weight change:   Intake/Output Summary (Last 24 hours) at 06/20/15 1856 Last data filed at 06/20/15 1800  Gross per 24 hour  Intake   1140 ml  Output   1753 ml  Net   -613 ml   Blood pressure 110/40, pulse 82, temperature 98.1 F (36.7 C), temperature source Oral, resp. rate 18, height 5\' 11"  (1.803 m), weight 172 lb 2.9 oz (78.1 kg), SpO2 96 %. Temp:  [97.4 F (36.3 C)-98.1 F (36.7 C)] 98.1 F (36.7 C) (08/14 1151) Pulse Rate:  [61-113] 82 (08/14 1608) Resp:  [10-25] 18 (08/14 1608) BP: (110-134)/(40-78) 110/40 mmHg (08/14 1608) SpO2:  [96 %-100 %] 96 % (08/14 0906)  Physical Exam: General: somnolent, confused but arousable HEENT: EOMI CVS regular rate, normal r,  no murmur rubs or gallops Chest: clear to auscultation bilaterally, no wheezing, rales or rhonchi Skin: right arm dressed  Neuro: nonfocal  CBC: CBC Latest Ref Rng 06/20/2015 06/19/2015 06/18/2015  WBC 4.0 - 10.5 K/uL 10.7(H) 14.1(H) 19.5(H)  Hemoglobin 13.0 - 17.0 g/dL 16.1 09.6 04.5  Hematocrit 39.0 - 52.0 % 37.9(L) 39.7 45.2  Platelets 150 - 400 K/uL 157 132(L) 139(L)       BMET  Recent Labs  06/19/15 0122 06/20/15 0320  NA 133* 136  K 3.3* 4.0  CL 100* 104  CO2 25 22  GLUCOSE 115* 136*  BUN 6 <5*  CREATININE 0.75 0.81  CALCIUM 8.6* 8.8*     Liver Panel   Recent Labs  06/18/15 1934 06/20/15 0320  PROT 7.5 6.9  ALBUMIN 3.7 3.2*  AST 19 26  ALT 12* 13*  ALKPHOS 80 65  BILITOT 0.9 0.5       Sedimentation Rate No results for input(s): ESRSEDRATE in the last 72 hours. C-Reactive Protein  Recent Labs  06/18/15 2254  CRP 12.9*    Micro Results: Recent Results (from the past 720 hour(s))  Blood Culture (routine x 2)     Status: None (Preliminary result)   Collection Time: 06/18/15  7:34 PM  Result Value Ref Range Status   Specimen Description BLOOD LEFT ARM  Final   Special Requests BOTTLES DRAWN AEROBIC AND ANAEROBIC  Final   Culture NO GROWTH 2 DAYS  Final   Report Status PENDING  Incomplete  Urine culture     Status: None   Collection Time: 06/18/15  7:46 PM  Result  Value Ref Range Status   Specimen Description URINE, CLEAN CATCH  Final   Special Requests NONE  Final   Culture 4,000 COLONIES/mL INSIGNIFICANT GROWTH  Final   Report Status 06/20/2015 FINAL  Final  Blood Culture (routine x 2)     Status: None (Preliminary result)   Collection Time: 06/18/15  8:39 PM  Result Value Ref Range Status   Specimen Description BLOOD LEFT HAND  Final   Special Requests BOTTLES DRAWN AEROBIC AND ANAEROBIC  Final   Culture NO GROWTH 2 DAYS  Final   Report Status PENDING  Incomplete  Wound culture     Status: None (Preliminary result)   Collection Time: 06/18/15 10:17 PM  Result Value Ref Range Status   Specimen Description WOUND RIGHT ARM  Final   Special Requests Normal  Final   Gram Stain PENDING  Incomplete   Culture   Final    MODERATE STREPTOCOCCUS GROUP C Performed at [facility]    Report Status PENDING  Incomplete  MRSA PCR Screening     Status: None   Collection Time: 06/19/15 12:25 AM  Result Value Ref Range Status   MRSA by PCR NEGATIVE NEGATIVE Final    Comment:        The GeneXpert MRSA Assay (FDA approved for NASAL specimens only), is one component of a comprehensive MRSA colonization surveillance program. It is not intended to diagnose MRSA infection nor to guide or monitor treatment for MRSA infections.     Studies/Results: Dg Eye Foreign Body  06/19/2015   CLINICAL DATA:  Metal working/exposure; clearance prior to MRI  EXAM: ORBITS FOR FOREIGN BODY - 2 VIEW  COMPARISON:  None.  FINDINGS: There is no evidence of metallic foreign body within the orbits. No significant bone abnormality identified.  IMPRESSION: No evidence of metallic foreign body within the orbits.   Electronically Signed   By: Lupita Raider, M.D.   On: 06/19/2015 07:02   Dg Elbow Complete Right  06/18/2015   CLINICAL DATA:  Right elbow abscess.  Infection for 1 week.  EXAM: RIGHT ELBOW - COMPLETE 3+ VIEW  COMPARISON:  None.  FINDINGS: Extensive soft  tissue swelling is present within the antecubital fossa. There is gas within the soft tissue as well along the medial aspect of the elbow. The elbow is intact.  IMPRESSION: 1. Soft tissue swelling gas along the medial aspect of the left elbow concerning for infection and developing abscess.   Electronically Signed   By: Marin Roberts M.D.   On: 06/18/2015 23:05   Mr Elbow Right W Wo Contrast  06/19/2015   CLINICAL  DATA:  24 y.o. male who presents with right elbow pain and swelling for a couple of days. Has had fever of 101. Uses IV opana and last use was 2 weeks ago. Also an alcoholic and drinks 1 fifth of vodka a day. Patient overdosed a few months ago and needed CPR. Pain is worse with any movement of the elbow. Does not radiate. Pain is severe.  EXAM: MRI OF THE RIGHT ELBOW WITHOUT AND WITH CONTRAST  TECHNIQUE: Multiplanar, multisequence MR imaging of the elbow was performed before and after the administration of intravenous contrast.  CONTRAST:  15mL MULTIHANCE GADOBENATE DIMEGLUMINE 529 MG/ML IV SOLN  COMPARISON:  None.  FINDINGS: TENDONS  Common forearm flexor origin: Intact.  Common forearm extensor origin: Intact.  Biceps: Intact.  Triceps: Intact.  LIGAMENTS  Medial stabilizers: Intact.  Lateral stabilizers:  Intact.  Cartilage: No chondral defect.  Joint: No joint effusion.  No synovitis.  Cubital tunnel: Normal cubital tunnel.  Bones: No marrow signal abnormality. No fracture or dislocation. No bone destruction or periosteal reaction.  Soft tissue: There is circumferential soft tissue edema within the subcutaneous fat around the elbow joint most severe anteriorly. There is a 2 x 0.7 x 2.1 cm fluid collection in the subcutaneous fat anteriorly at the level of the antecubital fossa consistent with an abscess. There are smaller fluid collections which extend deeper the remain superficial to the antecubital vasculature. There is mild edema in the anterior peripheral brachioradialis and brachialis  muscles without enhancement likely reflecting reactive changes secondary to adjacent inflammation.  IMPRESSION: 1. Cellulitis surrounding the right elbow bow severe anteriorly. There is a 2 x 0.7 x 2.1 cm fluid collection in the subcutaneous fat anteriorly at the level of the antecubital fossa consistent with an abscess. There are smaller fluid collections which extend deeper the remain superficial to the antecubital vasculature.   Electronically Signed   By: Elige Ko   On: 06/19/2015 08:38   Dg Chest Port 1 View  06/18/2015   CLINICAL DATA:  Fever, cough, wheezing, right arm infection  EXAM: PORTABLE CHEST - 1 VIEW  COMPARISON:  04/17/2015  FINDINGS: Lungs are clear.  No pleural effusion or pneumothorax.  The heart is normal in size.  IMPRESSION: No evidence of acute cardiopulmonary disease.   Electronically Signed   By: Charline Bills M.D.   On: 06/18/2015 19:36      Assessment/Plan:  Principal Problem:   Sepsis Active Problems:   Asthma with acute exacerbation   Polysubstance abuse   Alcohol abuse   Cellulitis of arm, left   IVDU (intravenous drug user)   Abscess of antecubital fossa: RUE   Hypokalemia   COPD exacerbation   H/O metal removed from eye   Blood poisoning    Bryce Hughes is a 24 y.o. male  with IVDU sp overdose and CPR prior admission now with abscesses in his antecubital fossa sp I and D  #1 AC fossa abscesses: "wound culture" growing a Group C streptococcal species. If this was pus from deep I abscess I would believe it as the likely pathogen.  He also has intraoperative cultures that are incubating  Continue unasyn and vancomycin but low threshold to dc the latter  #2 Delirium: likely due to Etoh withdrawal  #3 Screening: checking HIV and HCV. Surprising he was NOT screened for either during his prior admission with overdose      LOS: 2 days   Acey Lav 06/20/2015, 6:56 PM

## 2015-06-21 ENCOUNTER — Inpatient Hospital Stay (HOSPITAL_COMMUNITY): Payer: Self-pay

## 2015-06-21 ENCOUNTER — Encounter (HOSPITAL_COMMUNITY): Payer: Self-pay | Admitting: Orthopaedic Surgery

## 2015-06-21 ENCOUNTER — Other Ambulatory Visit: Payer: Self-pay

## 2015-06-21 ENCOUNTER — Inpatient Hospital Stay (HOSPITAL_COMMUNITY): Payer: MEDICAID

## 2015-06-21 DIAGNOSIS — R7881 Bacteremia: Secondary | ICD-10-CM

## 2015-06-21 DIAGNOSIS — G9340 Encephalopathy, unspecified: Secondary | ICD-10-CM | POA: Diagnosis not present

## 2015-06-21 DIAGNOSIS — F10931 Alcohol use, unspecified with withdrawal delirium: Secondary | ICD-10-CM | POA: Diagnosis not present

## 2015-06-21 DIAGNOSIS — A491 Streptococcal infection, unspecified site: Secondary | ICD-10-CM | POA: Insufficient documentation

## 2015-06-21 DIAGNOSIS — F10231 Alcohol dependence with withdrawal delirium: Secondary | ICD-10-CM | POA: Diagnosis not present

## 2015-06-21 LAB — BASIC METABOLIC PANEL
Anion gap: 7 (ref 5–15)
BUN: 12 mg/dL (ref 6–20)
CALCIUM: 8.8 mg/dL — AB (ref 8.9–10.3)
CO2: 25 mmol/L (ref 22–32)
CREATININE: 0.73 mg/dL (ref 0.61–1.24)
Chloride: 107 mmol/L (ref 101–111)
GFR calc Af Amer: >60 mL/min (ref >60)
GLUCOSE: 131 mg/dL — AB (ref 65–99)
Potassium: 4.5 mmol/L (ref 3.5–5.1)
SODIUM: 139 mmol/L (ref 135–145)

## 2015-06-21 LAB — WOUND CULTURE: SPECIAL REQUESTS: NORMAL

## 2015-06-21 LAB — CBC
HCT: 38 % — ABNORMAL LOW (ref 39.0–52.0)
Hemoglobin: 13 g/dL (ref 13.0–17.0)
MCH: 33.8 pg (ref 26.0–34.0)
MCHC: 34.2 g/dL (ref 30.0–36.0)
MCV: 98.7 fL (ref 78.0–100.0)
PLATELETS: 195 10*3/uL (ref 150–400)
RBC: 3.85 MIL/uL — ABNORMAL LOW (ref 4.22–5.81)
RDW: 13.3 % (ref 11.5–15.5)
WBC: 11.4 10*3/uL — AB (ref 4.0–10.5)

## 2015-06-21 LAB — HIV ANTIBODY (ROUTINE TESTING W REFLEX): HIV Screen 4th Generation wRfx: NONREACTIVE

## 2015-06-21 LAB — HEPATITIS C ANTIBODY

## 2015-06-21 MED ORDER — PREDNISONE 50 MG PO TABS
60.0000 mg | ORAL_TABLET | Freq: Every day | ORAL | Status: DC
  Administered 2015-06-22: 60 mg via ORAL
  Filled 2015-06-21 (×3): qty 1

## 2015-06-21 NOTE — Progress Notes (Signed)
Pt is bradycardic in the low 40's with little to no p-waves. Rhythm appears to be junctional.  Notified Lenny Pastel NP and obtained an order for a 12-lead EKG.  EKG will be recorded immediately.  Will continue to monitor.

## 2015-06-21 NOTE — Progress Notes (Signed)
[facility]    Subjective: Wants to go home   Antibiotics:  Anti-infectives    Start     Dose/Rate Route Frequency Ordered Stop   06/19/15 1400  Ampicillin-Sulbactam (UNASYN) 3 g in sodium chloride 0.9 % 100 mL IVPB     3 g 100 mL/hr over 60 Minutes Intravenous Every 6 hours 06/19/15 0951     06/19/15 0600  piperacillin-tazobactam (ZOSYN) IVPB 3.375 g  Status:  Discontinued     3.375 g 12.5 mL/hr over 240 Minutes Intravenous 3 times per day 06/19/15 0206 06/19/15 0911   06/19/15 0600  vancomycin (VANCOCIN) IVPB 1000 mg/200 mL premix  Status:  Discontinued     1,000 mg 200 mL/hr over 60 Minutes Intravenous Every 8 hours 06/19/15 0206 06/21/15 0817   06/18/15 1915  piperacillin-tazobactam (ZOSYN) IVPB 3.375 g     3.375 g 100 mL/hr over 30 Minutes Intravenous  Once 06/18/15 1906 06/18/15 2114   06/18/15 1915  vancomycin (VANCOCIN) IVPB 1000 mg/200 mL premix     1,000 mg 200 mL/hr over 60 Minutes Intravenous  Once 06/18/15 1906 06/18/15 2200      Medications: Scheduled Meds: . ampicillin-sulbactam (UNASYN) IV  3 g Intravenous Q6H  . benzonatate  200 mg Oral TID  . folic acid  1 mg Oral Daily  . multivitamin with minerals  1 tablet Oral Daily  . nicotine  21 mg Transdermal Daily  . [START ON 06/22/2015] predniSONE  60 mg Oral QAC breakfast  . thiamine  100 mg Oral Daily   Continuous Infusions:   PRN Meds:.acetaminophen **OR** acetaminophen, alum & mag hydroxide-simeth, dicyclomine, HYDROmorphone (DILAUDID) injection, hydrOXYzine, levalbuterol, loperamide, LORazepam **OR** LORazepam, LORazepam, methocarbamol, naproxen, ondansetron **OR** ondansetron (ZOFRAN) IV, oxyCODONE    Objective: Weight change:   Intake/Output Summary (Last 24 hours) at 06/21/15 1429 Last data filed at 06/21/15 1340  Gross per 24 hour  Intake   2225 ml  Output   1400 ml  Net    825 ml   Blood pressure 131/67, pulse 79, temperature 97.7 F (36.5  C), temperature source Oral, resp. rate 16, height 5\' 11"  (1.803 m), weight 172 lb 2.9 oz (78.1 kg), SpO2 98 %. Temp:  [97 F (36.1 C)-97.7 F (36.5 C)] 97.7 F (36.5 C) (08/15 1253) Pulse Rate:  [50-82] 79 (08/15 1400) Resp:  [13-25] 16 (08/15 1400) BP: (107-131)/(40-70) 131/67 mmHg (08/15 1400) SpO2:  [96 %-99 %] 98 % (08/15 1400)  Physical Exam: General: somnolent, awake and alert but somewhat irritable HEENT: EOMI CVS regular rate, normal r,  no murmur rubs or gallops Chest: clear to auscultation bilaterally, no wheezing, rales or rhonchi Skin: right arm dressed  Neuro: nonfocal  CBC: CBC Latest Ref Rng 06/21/2015 06/20/2015 06/19/2015  WBC 4.0 - 10.5 K/uL 11.4(H) 10.7(H) 14.1(H)  Hemoglobin 13.0 - 17.0 g/dL 16.1 09.6 04.5  Hematocrit 39.0 - 52.0 % 38.0(L) 37.9(L) 39.7  Platelets 150 - 400 K/uL 195 157 132(L)       BMET  Recent Labs  06/20/15 0320 06/21/15 0254  NA 136 139  K 4.0 4.5  CL 104 107  CO2 22 25  GLUCOSE 136* 131*  BUN <5* 12  CREATININE 0.81 0.73  CALCIUM 8.8* 8.8*     Liver Panel   Recent Labs  06/18/15 1934 06/20/15 0320  PROT 7.5 6.9  ALBUMIN 3.7 3.2*  AST 19 26  ALT 12* 13*  ALKPHOS 80  65  BILITOT 0.9 0.5       Sedimentation Rate No results for input(s): ESRSEDRATE in the last 72 hours. C-Reactive Protein  Recent Labs  06/18/15 2254  CRP 12.9*    Micro Results: Recent Results (from the past 720 hour(s))  Blood Culture (routine x 2)     Status: None (Preliminary result)   Collection Time: 06/18/15  7:34 PM  Result Value Ref Range Status   Specimen Description BLOOD LEFT ARM  Final   Special Requests BOTTLES DRAWN AEROBIC AND ANAEROBIC  Final   Culture NO GROWTH 3 DAYS  Final   Report Status PENDING  Incomplete  Urine culture     Status: None   Collection Time: 06/18/15  7:46 PM  Result Value Ref Range Status   Specimen Description URINE, CLEAN CATCH  Final   Special Requests NONE  Final   Culture 4,000  COLONIES/mL INSIGNIFICANT GROWTH  Final   Report Status 06/20/2015 FINAL  Final  Blood Culture (routine x 2)     Status: None (Preliminary result)   Collection Time: 06/18/15  8:39 PM  Result Value Ref Range Status   Specimen Description BLOOD LEFT HAND  Final   Special Requests BOTTLES DRAWN AEROBIC AND ANAEROBIC  Final   Culture NO GROWTH 3 DAYS  Final   Report Status PENDING  Incomplete  Wound culture     Status: None   Collection Time: 06/18/15 10:17 PM  Result Value Ref Range Status   Specimen Description WOUND RIGHT ARM  Final   Special Requests Normal  Final   Gram Stain   Final    MODERATE WBC PRESENT, PREDOMINANTLY PMN RARE SQUAMOUS EPITHELIAL CELLS PRESENT MODERATE GRAM POSITIVE COCCI IN PAIRS Performed at [facility]    Culture   Final    MODERATE STREPTOCOCCUS GROUP C Performed at [facility]    Report Status 06/21/2015 FINAL  Final  MRSA PCR Screening     Status: None   Collection Time: 06/19/15 12:25 AM  Result Value Ref Range Status   MRSA by PCR NEGATIVE NEGATIVE Final    Comment:        The GeneXpert MRSA Assay (FDA approved for NASAL specimens only), is one component of a comprehensive MRSA colonization surveillance program. It is not intended to diagnose MRSA infection nor to guide or monitor treatment for MRSA infections.   Anaerobic culture     Status: None (Preliminary result)   Collection Time: 06/20/15  7:57 AM  Result Value Ref Range Status   Specimen Description ABSCESS  Final   Special Requests RIGHT ELBOW  Final   Gram Stain   Final    NO WBC SEEN NO SQUAMOUS EPITHELIAL CELLS SEEN NO ORGANISMS SEEN Performed at [facility]    Culture   Final    NO ANAEROBES ISOLATED; CULTURE IN PROGRESS FOR 5 DAYS Performed at [facility]    Report Status PENDING  Incomplete  Culture, routine-abscess     Status: None (Preliminary result)   Collection Time: 06/20/15  7:57 AM  Result Value Ref Range Status    Specimen Description ABSCESS  Final   Special Requests RIGHT ELBOW  Final   Gram Stain   Final    NO WBC SEEN NO SQUAMOUS EPITHELIAL CELLS SEEN NO ORGANISMS SEEN Performed at [facility]    Culture PENDING  Incomplete   Report Status PENDING  Incomplete    Studies/Results: No results found.    Assessment/Plan:  Principal Problem:   Sepsis Active Problems:   Asthma with acute exacerbation   Polysubstance abuse   Alcohol abuse   Cellulitis of arm, left   IVDU (intravenous drug user)   Abscess of antecubital fossa: RUE   Hypokalemia   COPD exacerbation   H/O metal removed from eye   Blood poisoning   Alcohol withdrawal   Acute encephalopathy   Alcohol withdrawal delirium    Bryce Hughes is a 24 y.o. male  with IVDU sp overdose and CPR prior admission now with abscesses in his antecubital fossa sp I and D  #1 AC fossa abscesses: "wound culture" growing a Group C streptococcal species. If this was pus from deep I abscess I would believe it as the likely pathogen.  He also has intraoperative cultures that are incubating with no organisms seen on GS Continue unasyn and  I have DC vancomycin  He is to have his drain removed tomorrow  PROVIDED he does well on the unasyn and no NEW ORGANISM grows from abscess culture that is not covered by unasyn, I would continue the unasyn and then dc him on augmentin to complete 2 weeks of antibiotics posoperatively   #2 Delirium: likely due to Etoh withdrawal  #3 Screening:HIV  Negative and checking  HCV.   #4 IVDU and etohism: needs to get into treatment program for both  I will sign off at this point.  Please do not hesitate to call back with further questions or concerns. If the patient needs to be seen again formally this admission please contact Dr. Orvan Falconer who is picking up the service tomorrow.     LOS: 3 days   Acey Lav 06/21/2015, 2:29 PM

## 2015-06-21 NOTE — Progress Notes (Signed)
   Subjective:  Patient reports pain as marked. No events. O/n.  Objective:   VITALS:   Filed Vitals:   06/20/15 1950 06/20/15 2000 06/20/15 2350 06/21/15 0420  BP:  108/50 126/70 107/50  Pulse: 66  72 50  Temp:   97.6 F (36.4 C) 97 F (36.1 C)  TempSrc:   Oral Axillary  Resp: Height:      Weight:      SpO2:    97%    Neurologically intact Neurovascular intact Sensation intact distally Intact pulses distally Dorsiflexion/Plantar flexion intact Incision: dressing C/D/I and no drainage No cellulitis present Compartment soft   Lab Results  Component Value Date   WBC 11.4* 06/21/2015   HGB 13.0 06/21/2015   HCT 38.0* 06/21/2015   MCV 98.7 06/21/2015   PLT 195 06/21/2015     Assessment/Plan:  1 Day Post-Op   - Expected postop acute blood loss anemia - will monitor for symptoms - Up with PT/OT - DVT ppx - SCDs, ambulation - WBAT operative extremity, no ROM for 1-2 weeks for soft tissue rest - wound cx strep from Friday, treatment recs per ID - will remove penrose tomorrow  Cheral Almas 06/21/2015, 7:55 AM [phone]

## 2015-06-21 NOTE — Progress Notes (Signed)
[facility] PROGRESS NOTE  Bryce Hughes:[id number] DOB: [birth year] DOA: 06/18/2015 PCP: No primary care provider on file.  Assessment/Plan: #1 sepsis secondary to right antecubital fossa cellulitis Secondary to IVDU. Patient denies any chest or shortness of breath. Slight improvement with cellulitis with decreased erythema. MRI of the elbow with a 2 x 0.7 x 2.1 cm fluid collection in the subcutaneous fat anteriorly at the level of the antecubital fossa consistent with abscess. Smaller fluid collections extend deeper but remains superficial to the antecubital vasculature. Wound site is draining. Wound cultures with Streptococcus group C. Cultures were taken in the operating room during I and D which are pending. Blood cultures are pending. WBC trending down. 2-D echo pending to rule out endocarditis.Patient status post incision and drainage of right antecubital fossa multiple abscesses, complicated, adjacent tissue rearrangement right elbow 3 x 2 cm per Dr. Roda Shutters 06/20/2015. Continue empiric IV vancomycin and IV Unasyn. ID following. Orthopedics following.  #2 right antecubital fossa abscess/cellulitis right antecubital fossa Secondary to IV drug use. MRI of the elbow with a 2 x 0.7 x 2.1 cm fluid collection in the subcutaneous fat anteriorly at the level of the antecubital fossa consistent with abscess. Smaller fluid collections extend deeper but remains superficial to the antecubital vasculature. Wound site is draining. Wound cultures growing Streptococcus group C. Cultures were drawn and the oral after I and D. Blood cultures are pending. 2-D echo pending to rule out endocarditis. Patient status post incision and drainage of right antecubital fossa multiple abscesses, complicated, adjacent tissue rearrangement right elbow 3 x 2 cm per Dr. Roda Shutters 06/20/2015. Continue empiric IV vancomycin and IV Unasyn. ID following. Orthopedics following.PT/OT.  #3 hypokalemia Repleted.  #4 polysubstance  abuse/IVDU Patient states last use was 2 weeks prior to admission. UDS was positive for opiates and benzos. Patient admonished on polysubstance use. Social work Administrator, sports. Continue clonidine detox protocol.  #5 alcohol abuse/ETOH withdrawal delirium Continue the Ativan withdrawal protocol. Continue folic acid. Thiamine.  #6 acute encephalopathy Secondary to alcohol withdrawal and possibly opiate withdrawal. Clinical improvement. Continue the Ativan withdrawal protocol and the clonidine detox protocol. Supportive care.  #7 tobacco abuse Tobacco cessation. Continue nicotine patch.  #8 asthma versus COPD with acute exacerbation Clinical improvement. Change IV Solu-Medrol to oral prednisone taper. Continue nebulizer treatments, Tessalon Perles, empiric IV antibiotics.   #9 prophylaxis SCDs for DVT prophylaxis.    Code Status: Full Family Communication: Updated patient, father at bedside. Disposition Plan: Remain in the step down unit.   Consultants: Orthopedics: Dr. Roda Shutters 06/18/2015 ID consultation: Dr Daiva Eves 06/19/15  Procedures:  MRI right elbow 06/19/2015  Plain films of the orbits 06/19/2015 1. Incision and drainage of right antecubital fossa multiple abscesses, complicated 2. Adjacent tissue rearrangement right elbow 3 x 2 cm--Per Dr Roda Shutters 06/19/15   Antibiotics:  IV vancomycin 06/19/2015  IV Zosyn 06/19/2015>>>> 06/19/2015    IV Unasyn 06/19/2015  HPI/Subjective: Patient sleeping easily arousable. Patient less confused today than yesterday per father. Patient complaining of pain in his right upper extremity and then falls asleep.  Objective: Filed Vitals:   06/21/15 0802  BP: 129/65  Pulse: 72  Temp: 97.6 F (36.4 C)  Resp: 20    Intake/Output Summary (Last 24 hours) at 06/21/15 0902 Last data filed at 06/21/15 0800  Gross per 24 hour  Intake   2815 ml  Output    600 ml  Net   2215 ml   Filed Weights   06/18/15 1900 06/18/15 1957 06/19/15 0005  Weight: 79.379 kg (175 lb) 79.379 kg (175 lb) 78.1 kg (172 lb 2.9 oz)    Exam:   General:  NAD.   Cardiovascular: RRR , no murmurs rubs or gallops.   Respiratory: CTAB  Abdomen: Soft, nontender, nondistended, positive bowel sounds   Musculoskeletal:  no clubbing cyanosis or edema. Right upper extremity bandaged postop.  Data Reviewed: Basic Metabolic Panel:  Recent Labs Lab 06/18/15 1934 06/19/15 0122 06/19/15 0900 06/20/15 0320 06/21/15 0254  NA 131* 133*  --  136 139  K 3.8 3.3*  --  4.0 4.5  CL 96* 100*  --  104 107  CO2 21* 25  --  22 25  GLUCOSE 103* 115*  --  136* 131*  BUN 5* 6  --  <5* 12  CREATININE 0.73 0.75  --  0.81 0.73  CALCIUM 9.1 8.6*  --  8.8* 8.8*  MG  --   --  1.6* 2.1  --    Liver Function Tests:  Recent Labs Lab 06/18/15 1934 06/20/15 0320  AST 19 26  ALT 12* 13*  ALKPHOS 80 65  BILITOT 0.9 0.5  PROT 7.5 6.9  ALBUMIN 3.7 3.2*   No results for input(s): LIPASE, AMYLASE in the last 168 hours. No results for input(s): AMMONIA in the last 168 hours. CBC:  Recent Labs Lab 06/18/15 1934 06/19/15 0122 06/20/15 0320 06/21/15 0254  WBC 19.5* 14.1* 10.7* 11.4*  NEUTROABS 15.3*  --  10.0*  --   HGB 16.3 14.0 13.0 13.0  HCT 45.2 39.7 37.9* 38.0*  MCV 95.2 94.5 95.7 98.7  PLT 139* 132* 157 195   Cardiac Enzymes: No results for input(s): CKTOTAL, CKMB, CKMBINDEX, TROPONINI in the last 168 hours. BNP (last 3 results) No results for input(s): BNP in the last 8760 hours.  ProBNP (last 3 results) No results for input(s): PROBNP in the last 8760 hours.  CBG: No results for input(s): GLUCAP in the last 168 hours.  Recent Results (from the past 240 hour(s))  Blood Culture (routine x 2)     Status: None (Preliminary result)   Collection Time: 06/18/15  7:34 PM  Result Value Ref Range Status   Specimen Description BLOOD LEFT ARM  Final   Special Requests BOTTLES DRAWN AEROBIC AND ANAEROBIC  Final   Culture NO GROWTH 2 DAYS  Final    Report Status PENDING  Incomplete  Urine culture     Status: None   Collection Time: 06/18/15  7:46 PM  Result Value Ref Range Status   Specimen Description URINE, CLEAN CATCH  Final   Special Requests NONE  Final   Culture 4,000 COLONIES/mL INSIGNIFICANT GROWTH  Final   Report Status 06/20/2015 FINAL  Final  Blood Culture (routine x 2)     Status: None (Preliminary result)   Collection Time: 06/18/15  8:39 PM  Result Value Ref Range Status   Specimen Description BLOOD LEFT HAND  Final   Special Requests BOTTLES DRAWN AEROBIC AND ANAEROBIC  Final   Culture NO GROWTH 2 DAYS  Final   Report Status PENDING  Incomplete  Wound culture     Status: None   Collection Time: 06/18/15 10:17 PM  Result Value Ref Range Status   Specimen Description WOUND RIGHT ARM  Final   Special Requests Normal  Final   Gram Stain   Final    MODERATE WBC PRESENT, PREDOMINANTLY PMN RARE SQUAMOUS EPITHELIAL CELLS PRESENT MODERATE GRAM POSITIVE COCCI IN PAIRS Performed at [facility]  Culture   Final    MODERATE STREPTOCOCCUS GROUP C Performed at [facility]    Report Status 06/21/2015 FINAL  Final  MRSA PCR Screening     Status: None   Collection Time: 06/19/15 12:25 AM  Result Value Ref Range Status   MRSA by PCR NEGATIVE NEGATIVE Final    Comment:        The GeneXpert MRSA Assay (FDA approved for NASAL specimens only), is one component of a comprehensive MRSA colonization surveillance program. It is not intended to diagnose MRSA infection nor to guide or monitor treatment for MRSA infections.      Studies: No results found.  Scheduled Meds: . ampicillin-sulbactam (UNASYN) IV  3 g Intravenous Q6H  . benzonatate  200 mg Oral TID  . folic acid  1 mg Oral Daily  . methylPREDNISolone (SOLU-MEDROL) injection  60 mg Intravenous Q12H  . multivitamin with minerals  1 tablet Oral Daily  . nicotine  21 mg Transdermal Daily  . thiamine  100 mg Oral Daily   Continuous  Infusions: . sodium chloride 75 mL/hr at 06/20/15 1008    Principal Problem:   Sepsis Active Problems:   Abscess of antecubital fossa: RUE   Asthma with acute exacerbation   Polysubstance abuse   Alcohol abuse   Cellulitis of arm, left   IVDU (intravenous drug user)   Hypokalemia   COPD exacerbation   H/O metal removed from eye   Blood poisoning   Alcohol withdrawal   Acute encephalopathy   Alcohol withdrawal delirium    Time spent: 40 minutes    Dezarae Mcclaran M.D. [facility] Pager [phone]. If 7PM-7AM, please contact night-coverage at [email or web address], password [facility] 06/21/2015, 9:02 AM  LOS: 3 days

## 2015-06-21 NOTE — Progress Notes (Signed)
  Echocardiogram 2D Echocardiogram has been performed.  Leta Jungling M 06/21/2015, 11:21 AM

## 2015-06-21 NOTE — Evaluation (Signed)
Physical Therapy Evaluation/ Discharge Patient Details Name: Bryce Hughes MRN: [id number] DOB: 1991-06-17 Today's Date: 06/21/2015   History of Present Illness  Bryce Hughes is a 24 y.o. male with a history of Polysubstance abuse and Alcohol Abuse who presents to the ED with complaints of fevers and chills and increasing redness and swelling of his right arm.Pt s/p I&D of right antecubital fossa  Clinical Impression  Pt moving well and able to don shorts with left hand, perform transfers and gait without assist. Pt asking to smoke a cigarette and educated for decreased healing time. Pt states he is right handed, educated for no ROM currently right elbow and states he has been able to feed, toilet and dress himself with a little extra time with LUE and has family available to assist if needed. Pt reports no further therapy needs and agreeable to discharge of all therapy services. Will sign off.     Follow Up Recommendations No PT follow up    Equipment Recommendations  None recommended by PT    Recommendations for Other Services       Precautions / Restrictions Precautions Precaution Comments: no ROM Right elbow Restrictions Weight Bearing Restrictions: Yes RUE Weight Bearing: Weight bearing as tolerated      Mobility  Bed Mobility Overal bed mobility: Independent                Transfers Overall transfer level: Independent                  Ambulation/Gait Ambulation/Gait assistance: Independent Ambulation Distance (Feet): 400 Feet Assistive device: None Gait Pattern/deviations: WFL(Within Functional Limits)   Gait velocity interpretation: at or above normal speed for age/gender    Stairs Stairs: Yes Stairs assistance: Independent Stair Management: Alternating pattern;Forwards Number of Stairs: 8    Wheelchair Mobility    Modified Rankin (Stroke Patients Only)       Balance                                              Pertinent Vitals/Pain Pain Assessment: No/denies pain  HR 59-74    Home Living Family/patient expects to be discharged to:: Private residence Living Arrangements: Parent Available Help at Discharge: Family;Available PRN/intermittently Type of Home: House Home Access: Stairs to enter   [employer] of Steps: 6 Home Layout: Two level;Bed/bath upstairs Home Equipment: None      Prior Function Level of Independence: Independent               Hand Dominance   Dominant Hand: Right    Extremity/Trunk Assessment   Upper Extremity Assessment: RUE deficits/detail RUE Deficits / Details: limited flexion secondary to splinting and restriction         Lower Extremity Assessment: Overall WFL for tasks assessed      Cervical / Trunk Assessment: Normal  Communication   Communication: No difficulties  Cognition Arousal/Alertness: Awake/alert Behavior During Therapy: WFL for tasks assessed/performed Overall Cognitive Status: Within Functional Limits for tasks assessed                      General Comments      Exercises        Assessment/Plan    PT Assessment Patent does not need any further PT services  PT Diagnosis Other (comment) (Asked to evaluate after I& D Right elbow)  PT Problem List    PT Treatment Interventions     PT Goals (Current goals can be found in the Care Plan section) Acute Rehab PT Goals PT Goal Formulation: All assessment and education complete, DC therapy    Frequency     Barriers to discharge        Co-evaluation               End of Session   Activity Tolerance: Patient tolerated treatment well Patient left: in bed;with call bell/phone within reach;with family/visitor present Nurse Communication: Mobility status         Time: 2536-6440 PT Time Calculation (min) (ACUTE ONLY): 15 min   Charges:   PT Evaluation $Initial PT Evaluation Tier I: 1 Procedure     PT G CodesDelorse Lek 06/21/2015, 12:57 PM Delaney Meigs, PT [phone]

## 2015-06-22 LAB — BASIC METABOLIC PANEL
ANION GAP: 9 (ref 5–15)
BUN: 7 mg/dL (ref 6–20)
CALCIUM: 8.4 mg/dL — AB (ref 8.9–10.3)
CO2: 23 mmol/L (ref 22–32)
Chloride: 106 mmol/L (ref 101–111)
Creatinine, Ser: 0.62 mg/dL (ref 0.61–1.24)
GLUCOSE: 76 mg/dL (ref 65–99)
Potassium: 4.1 mmol/L (ref 3.5–5.1)
Sodium: 138 mmol/L (ref 135–145)

## 2015-06-22 LAB — CBC
HCT: 40.3 % (ref 39.0–52.0)
HEMOGLOBIN: 14 g/dL (ref 13.0–17.0)
MCH: 33.4 pg (ref 26.0–34.0)
MCHC: 34.7 g/dL (ref 30.0–36.0)
MCV: 96.2 fL (ref 78.0–100.0)
PLATELETS: 257 10*3/uL (ref 150–400)
RBC: 4.19 MIL/uL — AB (ref 4.22–5.81)
RDW: 12.9 % (ref 11.5–15.5)
WBC: 6.5 10*3/uL (ref 4.0–10.5)

## 2015-06-22 LAB — HEPATITIS B SURFACE ANTIBODY, QUANTITATIVE

## 2015-06-22 LAB — HEPATITIS A ANTIBODY, TOTAL: HEP A TOTAL AB: NEGATIVE

## 2015-06-22 LAB — HEPATITIS B SURFACE ANTIGEN: HEP B S AG: NEGATIVE

## 2015-06-22 LAB — MAGNESIUM: Magnesium: 2 mg/dL (ref 1.7–2.4)

## 2015-06-22 MED ORDER — PREDNISONE 20 MG PO TABS
40.0000 mg | ORAL_TABLET | Freq: Every day | ORAL | Status: AC
  Administered 2015-06-23 – 2015-06-24 (×2): 40 mg via ORAL
  Filled 2015-06-22 (×2): qty 2

## 2015-06-22 NOTE — Progress Notes (Signed)
[facility] PROGRESS NOTE  SYLIS KETCHUM NWG:[id number] DOB: 1991/03/16 DOA: 06/18/2015 PCP: No primary care provider on file.  History of present illness/brief summary  Bryce Hughes is a 24 y.o. male with a history of Polysubstance abuse and Alcohol Abuse who presented to the ED with complaints of fevers and chills since the AM and increasing redness and swelling of his right arm. A pustule developed 1 week ago in the antecubital fossa and his arm continued to redden. He reported having a fever to 101. He has a history of IV Drug abuse, and reports that he crushes Opana Tablets and mixes with tap water and injects. He reported that he last injected over 2 weeks ago and is trying to stop. He states that he drinks a fifth of Vodka daily and last drank a 4 pm on the day of admission.Patient overdosed a few months ago and needed CPR.  Patient was admitted to the step down unit with sepsis secondary to right antecubital fossa cellulitis and abscess. Wound cultures were obtained in the ED and patient placed empirically on IV vancomycin and Zosyn initially and subsequently switched from Zosyn to IV Unasyn. Orthopedics was consulted patient underwent I and D in the OR and intraoperative cultures were obtained which are pending today. ID was also consulted and followed the patient throughout the hospitalization. Patient has been pancultured. IV vancomycin has been discontinued and patient currently on IV Unasyn while awaiting intraoperative cultures. 2-D echo was obtained which was negative for vegetations. Patient also noted to go into ETOH and Opiate withdrawal and was placed on the Ativan withdrawal protocoland clonidine detox protocol. Patient's withdrawal symptoms have improved. Currently awaiting intraoperative culture results to tailor antibiotics.      Assessment/Plan: #1 sepsis secondary to right antecubital fossa cellulitis and abscesses  Secondary to IVDU. Patient denies  any chest or shortness of breath. Improvement with cellulitis with decreased erythema. MRI of the elbow with a 2 x 0.7 x 2.1 cm fluid collection in the subcutaneous fat anteriorly at the level of the antecubital fossa consistent with abscess. Smaller fluid collections extend deeper but remains superficial to the antecubital vasculature. Wound cultures with Streptococcus group C. Cultures were taken in the operating room during I and D which are pending. Blood cultures are pending. WBC trending down. 2-D echo with EF of 60%, no wall motion abnormalities, no evidence of vegetation seen.Patient status post incision and drainage of right antecubital fossa multiple abscesses, complicated, adjacent tissue rearrangement right elbow 3 x 2 cm per Dr. Roda Shutters 06/20/2015. intraoperative cultures pending. Continue empiric IV Unasyn. IV vancomycin has been discontinued yesterday per Infectious diseases. Per ID if patient continues to improve on Unasyn and no new organisms grow from intraoperative abscess cultures that are not covered by Unasyn will continue patient on Unasyn and discharged on Augmentin to complete a two-week course of antibiotics postoperatively. ID following. Orthopedics following.  #2 right antecubital fossa abscess/cellulitis right antecubital fossa Secondary to IV drug use. MRI of the elbow with a 2 x 0.7 x 2.1 cm fluid collection in the subcutaneous fat anteriorly at the level of the antecubital fossa consistent with abscess. Smaller fluid collections extend deeper but remains superficial to the antecubital vasculature. Wound cultures growing Streptococcus group C. Cultures were drawn after I and D done per ortho in OR which are pending. Blood cultures are pending. 2-D echo with EF of 60%, no wall motion abnormalities, no vegetation seen.  Patient status post incision and drainage of right antecubital  fossa multiple abscesses, complicated, adjacent tissue rearrangement right elbow 3 x 2 cm per Dr. Roda Shutters  06/20/2015. Intraoperative cultures pending.Continue empiric IV Unasyn.  IV vancomycin was discontinued yesterday per infectious diseases. Per ID if patient continues to improve on Unasyn and no new organisms grow from intraoperative abscess cultures that are not covered by Unasyn will continue patient on Unasyn and discharged on Augmentin to complete a two-week course of antibiotics postoperatively. ID following. Orthopedics following.PT/OT.  #3 hypokalemia Repleted.  #4 polysubstance abuse/IVDU Patient states last use was 2 weeks prior to admission. UDS was positive for opiates and benzos. Patient admonished on polysubstance use. Social work Administrator, sports. Continue clonidine detox protocol.  #5 alcohol abuse/ETOH withdrawal delirium Continue the Ativan withdrawal protocol. Continue folic acid. Thiamine.  #6 acute encephalopathy Secondary to alcohol withdrawal and possibly opiate withdrawal. Clinical improvement. Continue the Ativan withdrawal protocol and the clonidine detox protocol. Supportive care.  #7 tobacco abuse Tobacco cessation. Continue nicotine patch.  #8 asthma versus COPD with acute exacerbation Clinical improvement. Continue nebulizer treatments, Tessalon Perles, empiric IV antibiotics, prednisone taper.   #9 prophylaxis SCDs for DVT prophylaxis.    Code Status: Full Family Communication: Updated patient. No family at bedside. Disposition Plan: Transfer to telemetry. Home pending intraoperative abscess cultures results.   Consultants: Orthopedics: Dr. Roda Shutters 06/18/2015 ID consultation: Dr Daiva Eves 06/19/15  Procedures:  MRI right elbow 06/19/2015  Plain films of the orbits 06/19/2015 1. Incision and drainage of right antecubital fossa multiple abscesses, complicated 2. Adjacent tissue rearrangement right elbow 3 x 2 cm--Per Dr Roda Shutters 06/19/15   Antibiotics:  IV vancomycin 06/19/2015>>>>> 06/21/2015  IV Zosyn 06/19/2015>>>> 06/19/2015    IV Unasyn  06/19/2015  HPI/Subjective: Patient alert. Patient denies CP. No SOB. Confusion improved. Wants to go home.  Objective: Filed Vitals:   06/22/15 0818  BP: 124/74  Pulse: 54  Temp: 96.5 F (35.8 C)  Resp: 13    Intake/Output Summary (Last 24 hours) at 06/22/15 0930 Last data filed at 06/22/15 0500  Gross per 24 hour  Intake   1980 ml  Output   3425 ml  Net  -1445 ml   Filed Weights   06/18/15 1900 06/18/15 1957 06/19/15 0005  Weight: 79.379 kg (175 lb) 79.379 kg (175 lb) 78.1 kg (172 lb 2.9 oz)    Exam:   General:  NAD.   Cardiovascular: RRR , no murmurs rubs or gallops.   Respiratory: CTAB  Abdomen: Soft, nontender, nondistended, positive bowel sounds   Musculoskeletal:  no clubbing cyanosis or edema. Right upper extremity bandaged postop.  Data Reviewed: Basic Metabolic Panel:  Recent Labs Lab 06/18/15 1934 06/19/15 0122 06/19/15 0900 06/20/15 0320 06/21/15 0254 06/22/15 0442  NA 131* 133*  --  136 139 138  K 3.8 3.3*  --  4.0 4.5 4.1  CL 96* 100*  --  104 107 106  CO2 21* 25  --  22 25 23   GLUCOSE 103* 115*  --  136* 131* 76  BUN 5* 6  --  <5* 12 7  CREATININE 0.73 0.75  --  0.81 0.73 0.62  CALCIUM 9.1 8.6*  --  8.8* 8.8* 8.4*  MG  --   --  1.6* 2.1  --  2.0   Liver Function Tests:  Recent Labs Lab 06/18/15 1934 06/20/15 0320  AST 19 26  ALT 12* 13*  ALKPHOS 80 65  BILITOT 0.9 0.5  PROT 7.5 6.9  ALBUMIN 3.7 3.2*   No results for input(s): LIPASE, AMYLASE  in the last 168 hours. No results for input(s): AMMONIA in the last 168 hours. CBC:  Recent Labs Lab 06/18/15 1934 06/19/15 0122 06/20/15 0320 06/21/15 0254  WBC 19.5* 14.1* 10.7* 11.4*  NEUTROABS 15.3*  --  10.0*  --   HGB 16.3 14.0 13.0 13.0  HCT 45.2 39.7 37.9* 38.0*  MCV 95.2 94.5 95.7 98.7  PLT 139* 132* 157 195   Cardiac Enzymes: No results for input(s): CKTOTAL, CKMB, CKMBINDEX, TROPONINI in the last 168 hours. BNP (last 3 results) No results for input(s): BNP in  the last 8760 hours.  ProBNP (last 3 results) No results for input(s): PROBNP in the last 8760 hours.  CBG: No results for input(s): GLUCAP in the last 168 hours.  Recent Results (from the past 240 hour(s))  Blood Culture (routine x 2)     Status: None (Preliminary result)   Collection Time: 06/18/15  7:34 PM  Result Value Ref Range Status   Specimen Description BLOOD LEFT ARM  Final   Special Requests BOTTLES DRAWN AEROBIC AND ANAEROBIC  Final   Culture NO GROWTH 3 DAYS  Final   Report Status PENDING  Incomplete  Urine culture     Status: None   Collection Time: 06/18/15  7:46 PM  Result Value Ref Range Status   Specimen Description URINE, CLEAN CATCH  Final   Special Requests NONE  Final   Culture 4,000 COLONIES/mL INSIGNIFICANT GROWTH  Final   Report Status 06/20/2015 FINAL  Final  Blood Culture (routine x 2)     Status: None (Preliminary result)   Collection Time: 06/18/15  8:39 PM  Result Value Ref Range Status   Specimen Description BLOOD LEFT HAND  Final   Special Requests BOTTLES DRAWN AEROBIC AND ANAEROBIC  Final   Culture NO GROWTH 3 DAYS  Final   Report Status PENDING  Incomplete  Wound culture     Status: None   Collection Time: 06/18/15 10:17 PM  Result Value Ref Range Status   Specimen Description WOUND RIGHT ARM  Final   Special Requests Normal  Final   Gram Stain   Final    MODERATE WBC PRESENT, PREDOMINANTLY PMN RARE SQUAMOUS EPITHELIAL CELLS PRESENT MODERATE GRAM POSITIVE COCCI IN PAIRS Performed at [facility]    Culture   Final    MODERATE STREPTOCOCCUS GROUP C Performed at [facility]    Report Status 06/21/2015 FINAL  Final  MRSA PCR Screening     Status: None   Collection Time: 06/19/15 12:25 AM  Result Value Ref Range Status   MRSA by PCR NEGATIVE NEGATIVE Final    Comment:        The GeneXpert MRSA Assay (FDA approved for NASAL specimens only), is one component of a comprehensive MRSA colonization surveillance  program. It is not intended to diagnose MRSA infection nor to guide or monitor treatment for MRSA infections.   Anaerobic culture     Status: None (Preliminary result)   Collection Time: 06/20/15  7:57 AM  Result Value Ref Range Status   Specimen Description ABSCESS  Final   Special Requests RIGHT ELBOW  Final   Gram Stain   Final    NO WBC SEEN NO SQUAMOUS EPITHELIAL CELLS SEEN NO ORGANISMS SEEN Performed at [facility]    Culture   Final    NO ANAEROBES ISOLATED; CULTURE IN PROGRESS FOR 5 DAYS Performed at [facility]    Report Status PENDING  Incomplete  Culture, routine-abscess  Status: None (Preliminary result)   Collection Time: 06/20/15  7:57 AM  Result Value Ref Range Status   Specimen Description ABSCESS  Final   Special Requests RIGHT ELBOW  Final   Gram Stain   Final    NO WBC SEEN NO SQUAMOUS EPITHELIAL CELLS SEEN NO ORGANISMS SEEN Performed at [facility]    Culture PENDING  Incomplete   Report Status PENDING  Incomplete     Studies: No results found.  Scheduled Meds: . ampicillin-sulbactam (UNASYN) IV  3 g Intravenous Q6H  . benzonatate  200 mg Oral TID  . folic acid  1 mg Oral Daily  . multivitamin with minerals  1 tablet Oral Daily  . nicotine  21 mg Transdermal Daily  . predniSONE  60 mg Oral QAC breakfast  . thiamine  100 mg Oral Daily   Continuous Infusions:    Principal Problem:   Sepsis Active Problems:   Abscess of antecubital fossa: RUE   Asthma with acute exacerbation   Polysubstance abuse   Alcohol abuse   Cellulitis of arm, left   IVDU (intravenous drug user)   Hypokalemia   COPD exacerbation   H/O metal removed from eye   Blood poisoning   Alcohol withdrawal   Acute encephalopathy   Alcohol withdrawal delirium   Group C streptococcal infection    Time spent: 40 minutes    Tarini Carrier M.D. [facility] Pager [phone]. If 7PM-7AM, please contact night-coverage at  [email or web address], password [facility] 06/22/2015, 9:30 AM  LOS: 4 days

## 2015-06-22 NOTE — Evaluation (Signed)
Occupational Therapy Evaluation Patient Details Name: Bryce Hughes MRN: [id number] DOB: Mar 14, 1991 Today's Date: 06/22/2015    History of Present Illness Bryce Hughes is a 24 y.o. male with a history of Polysubstance abuse and Alcohol Abuse who presents to the ED with complaints of fevers and chills and increasing redness and swelling of his right arm.Pt s/p I&D of right antecubital fossa   Clinical Impression   Pt was independent prior to admission and now able to perform self care and ADL at a modified independent level. No OT needs.    Follow Up Recommendations  No OT follow up    Equipment Recommendations  None recommended by OT    Recommendations for Other Services       Precautions / Restrictions Precautions Precaution Comments: no ROM Right elbow Restrictions Weight Bearing Restrictions: Yes RUE Weight Bearing: Weight bearing as tolerated      Mobility Bed Mobility Overal bed mobility: Independent                Transfers Overall transfer level: Independent               General transfer comment: ambulated to bathroom and to sink with assist for lines    Balance                                            ADL Overall ADL's : Modified independent                                             Vision     Perception     Praxis      Pertinent Vitals/Pain Pain Assessment: 0-10 Pain Score: 8  Pain Location: R UE Pain Descriptors / Indicators: Aching Pain Intervention(s): Monitored during session;Repositioned;Patient requesting pain meds-RN notified     Hand Dominance Right   Extremity/Trunk Assessment Upper Extremity Assessment Upper Extremity Assessment: RUE deficits/detail RUE Deficits / Details: limited elbow flexion secondary to splinting and restriction RUE Coordination: decreased gross motor   Lower Extremity Assessment Lower Extremity Assessment: Overall WFL for tasks assessed    Cervical / Trunk Assessment Cervical / Trunk Assessment: Normal   Communication Communication Communication: No difficulties   Cognition Arousal/Alertness: Awake/alert Behavior During Therapy: Flat affect Overall Cognitive Status: Within Functional Limits for tasks assessed                     General Comments       Exercises       Shoulder Instructions      Home Living Family/patient expects to be discharged to:: Private residence Living Arrangements: Parent Available Help at Discharge: Family;Available PRN/intermittently Type of Home: House Home Access: Stairs to enter [employer] of Steps: 6   Home Layout: Two level;Bed/bath upstairs Alternate Level Stairs-Number of Steps: 14   Bathroom Shower/Tub: Chief Strategy Officer: Standard     Home Equipment: None          Prior Functioning/Environment Level of Independence: Independent             OT Diagnosis:     OT Problem List:     OT Treatment/Interventions:      OT Goals(Current goals can be found in the care plan  section) Acute Rehab OT Goals Patient Stated Goal: go home as soon as possible  OT Frequency:     Barriers to D/C:            Co-evaluation              End of Session Nurse Communication: Patient requests pain meds  Activity Tolerance: Patient tolerated treatment well Patient left: in bed;with call bell/phone within reach;with nursing/sitter in room   Time: 0910-0926 OT Time Calculation (min): 16 min Charges:  OT General Charges $OT Visit: 1 Procedure OT Evaluation $Initial OT Evaluation Tier I: 1 Procedure G-Codes:    Evern Bio 06/22/2015, 9:26 AM  [phone]

## 2015-06-22 NOTE — Progress Notes (Signed)
   Subjective:  No events o/n  Objective:   VITALS:   Filed Vitals:   06/21/15 1600 06/21/15 2037 06/22/15 0012 06/22/15 0400  BP: 111/73 117/61 131/61   Pulse: 83 62 65   Temp:  97.7 F (36.5 C) 97.9 F (36.6 C) 98 F (36.7 C)  TempSrc:  Oral Oral Oral  Resp: Height:      Weight:      SpO2: 97% 97% 97%    Exam improved No cellulitis No pus   Lab Results  Component Value Date   WBC 11.4* 06/21/2015   HGB 13.0 06/21/2015   HCT 38.0* 06/21/2015   MCV 98.7 06/21/2015   PLT 195 06/21/2015     Assessment/Plan:  2 Days Post-Op   - abx per ID - penrose removed - dry dressing to incision daily - long arm splint for soft tissue rest - f/u in office in 1 week  Cheral Almas 06/22/2015, 7:46 AM [phone]

## 2015-06-22 NOTE — Progress Notes (Signed)
ANTIBIOTIC CONSULT NOTE - Follow Up  Pharmacy Consult for Unasyn Indication: cellulitis/abscess  No Known Allergies  Patient Measurements: Height:  (180.3 cm) Weight: 172 lb 2.9 oz (78.1 kg) IBW/kg (Calculated) : 75.3  Vital Signs: Temp: 96.5 F (35.8 C) (08/16 0818) Temp Source: Axillary (08/16 0818) BP: 124/74 mmHg (08/16 0818) Pulse Rate: 54 (08/16 0818)  Labs:  Recent Labs  06/20/15 0320 06/21/15 0254 06/22/15 0442  WBC 10.7* 11.4*  --   HGB 13.0 13.0  --   PLT 157 195  --   CREATININE 0.81 0.73 0.62   Estimated Creatinine Clearance: 153 mL/min (by C-G formula based on Cr of 0.62).  Assessment:  24 yr old male with hx IV drug use presented with significant cellulitis of right antecubital area with abscess that grew Group C Strep on culture. He has been changed to Unasyn and his renal function remains stable.   Plan:   Continue Unasyn 3gm IV q6hrs  As no dosing adjustments are anticipated pharmacy will sign off. Thank you for the consult.  Estella Husk, Pharm.D., BCPS, AAHIVP Clinical Pharmacist Phone: [phone] or [phone] 06/22/2015, 11:02 AM

## 2015-06-23 DIAGNOSIS — L02419 Cutaneous abscess of limb, unspecified: Secondary | ICD-10-CM

## 2015-06-23 DIAGNOSIS — A409 Streptococcal sepsis, unspecified: Secondary | ICD-10-CM

## 2015-06-23 DIAGNOSIS — F1010 Alcohol abuse, uncomplicated: Secondary | ICD-10-CM

## 2015-06-23 DIAGNOSIS — G9340 Encephalopathy, unspecified: Secondary | ICD-10-CM

## 2015-06-23 LAB — CULTURE, BLOOD (ROUTINE X 2)
CULTURE: NO GROWTH
CULTURE: NO GROWTH

## 2015-06-23 LAB — BASIC METABOLIC PANEL
ANION GAP: 8 (ref 5–15)
BUN: 10 mg/dL (ref 6–20)
CHLORIDE: 101 mmol/L (ref 101–111)
CO2: 26 mmol/L (ref 22–32)
Calcium: 8.4 mg/dL — ABNORMAL LOW (ref 8.9–10.3)
Creatinine, Ser: 0.71 mg/dL (ref 0.61–1.24)
GFR calc non Af Amer: >60 mL/min (ref >60)
GLUCOSE: 86 mg/dL (ref 65–99)
POTASSIUM: 3.9 mmol/L (ref 3.5–5.1)
Sodium: 135 mmol/L (ref 135–145)

## 2015-06-23 LAB — CBC
HEMATOCRIT: 42.2 % (ref 39.0–52.0)
HEMOGLOBIN: 14.7 g/dL (ref 13.0–17.0)
MCH: 33.4 pg (ref 26.0–34.0)
MCHC: 34.8 g/dL (ref 30.0–36.0)
MCV: 95.9 fL (ref 78.0–100.0)
Platelets: 288 10*3/uL (ref 150–400)
RBC: 4.4 MIL/uL (ref 4.22–5.81)
RDW: 12.6 % (ref 11.5–15.5)
WBC: 12.1 10*3/uL — AB (ref 4.0–10.5)

## 2015-06-23 LAB — MAGNESIUM: Magnesium: 2.1 mg/dL (ref 1.7–2.4)

## 2015-06-23 NOTE — Progress Notes (Signed)
Report called to Christy,RN. Patient to be transferred to 5W21. C.Tiaira Arambula,RN

## 2015-06-23 NOTE — Progress Notes (Signed)
PROGRESS NOTE  [name] UEA:[id number] DOB: [birth year] DOA: 06/18/2015 PCP: No primary care provider on file.  History of present illness/brief summary  Bryce Hughes is a 24 y.o. male with a history of Polysubstance abuse and Alcohol Abuse who presented to the ED with complaints of fevers and chills since the AM and increasing redness and swelling of his right arm. A pustule developed 1 week ago in the antecubital fossa and his arm continued to redden. He reported having a fever to 101. He has a history of IV Drug abuse, and reports that he crushes Opana Tablets and mixes with tap water and injects. He reported that he last injected over 2 weeks ago and is trying to stop. He states that he drinks a fifth of Vodka daily and last drank a 4 pm on the day of admission.Patient overdosed a few months ago and needed CPR.  Patient was admitted to the step down unit with sepsis secondary to right antecubital fossa cellulitis and abscess. Wound cultures were obtained in the ED and patient placed empirically on IV vancomycin and Zosyn initially and subsequently switched from Zosyn to IV Unasyn. Orthopedics was consulted patient underwent I and D in the OR and intraoperative cultures were obtained which are pending today. ID was also consulted and followed the patient throughout the hospitalization. Patient has been pancultured. IV vancomycin has been discontinued and patient currently on IV Unasyn while awaiting intraoperative cultures. 2-D echo was obtained which was negative for vegetations. Patient also noted to go into ETOH and Opiate withdrawal and was placed on the Ativan withdrawal protocoland clonidine detox protocol. Patient's withdrawal symptoms have improved.      Assessment/Plan: #1 sepsis secondary to right antecubital fossa cellulitis and abscesses  -Secondary to IVDU.  - Improvement with cellulitis with decreased erythema. MRI of the elbow with a 2 x 0.7 x  2.1 cm fluid collection in the subcutaneous fat anteriorly at the level of the antecubital fossa consistent with abscess. Smaller fluid collections extend deeper but remains superficial to the antecubital vasculature.  -"Wound" cultures with Streptococcus group C. Cultures were taken in the operating room during I and D which are pending.  -Blood cultures are negative.  -WBC trending down. 2-D echo with EF of 60%, no wall motion abnormalities, no evidence of vegetation seen. -Patient status post incision and drainage of right antecubital fossa multiple abscesses, complicated, adjacent tissue rearrangement right elbow 3 x 2 cm per Dr. Roda Shutters 06/20/2015. intraoperative cultures neg to date  -Continue empiric IV Unasyn. IV vancomycin has been discontinued yesterday per Infectious diseases.  -Per ID if patient continues to improve on Unasyn and no new organisms grow from intraoperative abscess cultures that are not covered by Unasyn will continue patient on Unasyn and discharged on Augmentin to complete a two-week course of antibiotics postoperatively. ID following. Orthopedics following.  #2 right antecubital fossa abscess/cellulitis right antecubital fossa Secondary to IV drug use. MRI of the elbow with a 2 x 0.7 x 2.1 cm fluid collection in the subcutaneous fat anteriorly at the level of the antecubital fossa consistent with abscess. Smaller fluid collections extend deeper but remains superficial to the antecubital vasculature. Wound cultures growing Streptococcus group C. Cultures were drawn after I and D done per ortho in OR which are pending. Blood cultures are pending. 2-D echo with EF of 60%, no wall motion abnormalities, no vegetation seen. Patient status post incision and drainage of right antecubital  fossa multiple abscesses, complicated, adjacent tissue rearrangement right elbow 3 x 2 cm per Dr. Roda Shutters 06/20/2015. Intraoperative cultures pending.Continue empiric IV Unasyn. IV vancomycin was discontinued  yesterday per infectious diseases. Per ID if patient continues to improve on Unasyn and no new organisms grow from intraoperative abscess cultures that are not covered by Unasyn will continue patient on Unasyn and discharged on Augmentin to complete a two-week course of antibiotics postoperatively. ID following. Orthopedics following.PT/OT.  #3 hypokalemia Repleted.  #4 polysubstance abuse/IVDU Patient states last use was 2 weeks prior to admission. UDS was positive for opiates and benzos. Patient admonished on polysubstance use. Social work Administrator, sports.   #5 alcohol abuse/ETOH withdrawal delirium Continue the Ativan withdrawal protocol. Continue folic acid. Thiamine.  #6 acute encephalopathy Secondary to alcohol withdrawal and possibly opiate withdrawal.  -Clinically improving but still requiring ativan -Continue the Ativan withdrawal protocolprotocol. Supportive care.  #7 tobacco abuse -Tobacco cessation. Continue nicotine patch.  #8 asthma versus COPD with acute exacerbation Clinical improvement. Continue nebulizer treatments, Tessalon Perles, empiric IV antibiotics, prednisone taper.   #9 prophylaxis SCDs for DVT prophylaxis.    Code Status: Full Family Communication: Updated father and grandfather at bedside Disposition Plan: Transfer to telemetry. Home pending intraoperative abscess cultures results.   Consultants: Orthopedics: Dr. Roda Shutters 06/18/2015 ID consultation: Dr Daiva Eves 06/19/15  Procedures:  MRI right elbow 06/19/2015  Plain films of the orbits 06/19/2015 1. Incision and drainage of right antecubital fossa multiple abscesses, complicated 2. Adjacent tissue rearrangement right elbow 3 x 2 cm--Per Dr Roda Shutters 06/19/15   Antibiotics:  IV vancomycin 06/19/2015>>>>> 06/21/2015  IV Zosyn 06/19/2015>>>> 06/19/2015   IV Unasyn 06/19/2015   Procedures/Studies: Dg Eye Foreign Body  06/19/2015   CLINICAL DATA:  Metal working/exposure; clearance prior to MRI  EXAM:  ORBITS FOR FOREIGN BODY - 2 VIEW  COMPARISON:  None.  FINDINGS: There is no evidence of metallic foreign body within the orbits. No significant bone abnormality identified.  IMPRESSION: No evidence of metallic foreign body within the orbits.   Electronically Signed   By: Lupita Raider, M.D.   On: 06/19/2015 07:02   Dg Elbow Complete Right  06/18/2015   CLINICAL DATA:  Right elbow abscess.  Infection for 1 week.  EXAM: RIGHT ELBOW - COMPLETE 3+ VIEW  COMPARISON:  None.  FINDINGS: Extensive soft tissue swelling is present within the antecubital fossa. There is gas within the soft tissue as well along the medial aspect of the elbow. The elbow is intact.  IMPRESSION: 1. Soft tissue swelling gas along the medial aspect of the left elbow concerning for infection and developing abscess.   Electronically Signed   By: Marin Roberts M.D.   On: 06/18/2015 23:05   Mr Elbow Right W Wo Contrast  06/19/2015   CLINICAL DATA:  24 y.o. male who presents with right elbow pain and swelling for a couple of days. Has had fever of 101. Uses IV opana and last use was 2 weeks ago. Also an alcoholic and drinks 1 fifth of vodka a day. Patient overdosed a few months ago and needed CPR. Pain is worse with any movement of the elbow. Does not radiate. Pain is severe.  EXAM: MRI OF THE RIGHT ELBOW WITHOUT AND WITH CONTRAST  TECHNIQUE: Multiplanar, multisequence MR imaging of the elbow was performed before and after the administration of intravenous contrast.  CONTRAST:  15mL MULTIHANCE GADOBENATE DIMEGLUMINE 529 MG/ML IV SOLN  COMPARISON:  None.  FINDINGS: TENDONS  Common forearm flexor origin: Intact.  Common forearm extensor origin: Intact.  Biceps: Intact.  Triceps: Intact.  LIGAMENTS  Medial stabilizers: Intact.  Lateral stabilizers:  Intact.  Cartilage: No chondral defect.  Joint: No joint effusion.  No synovitis.  Cubital tunnel: Normal cubital tunnel.  Bones: No marrow signal abnormality. No fracture or dislocation. No bone  destruction or periosteal reaction.  Soft tissue: There is circumferential soft tissue edema within the subcutaneous fat around the elbow joint most severe anteriorly. There is a 2 x 0.7 x 2.1 cm fluid collection in the subcutaneous fat anteriorly at the level of the antecubital fossa consistent with an abscess. There are smaller fluid collections which extend deeper the remain superficial to the antecubital vasculature. There is mild edema in the anterior peripheral brachioradialis and brachialis muscles without enhancement likely reflecting reactive changes secondary to adjacent inflammation.  IMPRESSION: 1. Cellulitis surrounding the right elbow bow severe anteriorly. There is a 2 x 0.7 x 2.1 cm fluid collection in the subcutaneous fat anteriorly at the level of the antecubital fossa consistent with an abscess. There are smaller fluid collections which extend deeper the remain superficial to the antecubital vasculature.   Electronically Signed   By: Elige Ko   On: 06/19/2015 08:38   Dg Chest Port 1 View  06/18/2015   CLINICAL DATA:  Fever, cough, wheezing, right arm infection  EXAM: PORTABLE CHEST - 1 VIEW  COMPARISON:  04/17/2015  FINDINGS: Lungs are clear.  No pleural effusion or pneumothorax.  The heart is normal in size.  IMPRESSION: No evidence of acute cardiopulmonary disease.   Electronically Signed   By: Charline Bills M.D.   On: 06/18/2015 19:36        Subjective: Patient received a dose of Ativan last night for agitation. This morning complains of right arm pain. Denies any fevers, chills, chest pain, shortness breath, nausea, vomiting, diarrhea, abdominal pain.  Objective: Filed Vitals:   06/22/15 1941 06/23/15 0016 06/23/15 0406 06/23/15 0800  BP: 118/53 126/70 112/70 131/81  Pulse: 78 54 42 48  Temp: 97.6 F (36.4 C) 97.3 F (36.3 C) 97.8 F (36.6 C) 97.6 F (36.4 C)  TempSrc: Oral Oral Oral Oral  Resp: Height:      Weight:      SpO2: 98% 97% 99% 96%     Intake/Output Summary (Last 24 hours) at 06/23/15 0942 Last data filed at 06/23/15 0406  Gross per 24 hour  Intake   1160 ml  Output   2350 ml  Net  -1190 ml   Weight change:  Exam:   General:  Pt is alert, follows commands appropriately, not in acute distress  HEENT: No icterus, No thrush, No neck mass, [place]/AT  Cardiovascular: RRR, S1/S2, no rubs, no gallops  Respiratory: CTA bilaterally, no wheezing, no crackles, no rhonchi  Abdomen: Soft/+BS, non tender, non distended, no guarding Extremities: Right arm edema without any lymphangitis or crepitance. Serosanguineous drainage from incision. No crepitance. Data Reviewed: Basic Metabolic Panel:  Recent Labs Lab 06/19/15 0122 06/19/15 0900 06/20/15 0320 06/21/15 0254 06/22/15 0442 06/23/15 0351  NA 133*  --  136 139 138 135  K 3.3*  --  4.0 4.5 4.1 3.9  CL 100*  --  104 107 106 101  CO2 25  --  GLUCOSE 115*  --  136* 131* 76 86  BUN 6  --  <5* CREATININE 0.75  --  0.81 0.73 0.62 0.71  CALCIUM  8.6*  --  8.8* 8.8* 8.4* 8.4*  MG  --  1.6* 2.1  --  2.0 2.1   Liver Function Tests:  Recent Labs Lab 06/18/15 1934 06/20/15 0320  AST 19 26  ALT 12* 13*  ALKPHOS 80 65  BILITOT 0.9 0.5  PROT 7.5 6.9  ALBUMIN 3.7 3.2*   No results for input(s): LIPASE, AMYLASE in the last 168 hours. No results for input(s): AMMONIA in the last 168 hours. CBC:  Recent Labs Lab 06/18/15 1934 06/19/15 0122 06/20/15 0320 06/21/15 0254 06/22/15 0730 06/23/15 0351  WBC 19.5* 14.1* 10.7* 11.4* 6.5 12.1*  NEUTROABS 15.3*  --  10.0*  --   --   --   HGB 16.3 14.0 13.0 13.0 14.0 14.7  HCT 45.2 39.7 37.9* 38.0* 40.3 42.2  MCV 95.2 94.5 95.7 98.7 96.2 95.9  PLT 139* 132* 157 195 257 288   Cardiac Enzymes: No results for input(s): CKTOTAL, CKMB, CKMBINDEX, TROPONINI in the last 168 hours. BNP: Invalid input(s): POCBNP CBG: No results for input(s): GLUCAP in the last 168 hours.  Recent Results (from the  past 240 hour(s))  Blood Culture (routine x 2)     Status: None (Preliminary result)   Collection Time: 06/18/15  7:34 PM  Result Value Ref Range Status   Specimen Description BLOOD LEFT ARM  Final   Special Requests BOTTLES DRAWN AEROBIC AND ANAEROBIC  Final   Culture NO GROWTH 4 DAYS  Final   Report Status PENDING  Incomplete  Urine culture     Status: None   Collection Time: 06/18/15  7:46 PM  Result Value Ref Range Status   Specimen Description URINE, CLEAN CATCH  Final   Special Requests NONE  Final   Culture 4,000 COLONIES/mL INSIGNIFICANT GROWTH  Final   Report Status 06/20/2015 FINAL  Final  Blood Culture (routine x 2)     Status: None (Preliminary result)   Collection Time: 06/18/15  8:39 PM  Result Value Ref Range Status   Specimen Description BLOOD LEFT HAND  Final   Special Requests BOTTLES DRAWN AEROBIC AND ANAEROBIC  Final   Culture NO GROWTH 4 DAYS  Final   Report Status PENDING  Incomplete  Wound culture     Status: None   Collection Time: 06/18/15 10:17 PM  Result Value Ref Range Status   Specimen Description WOUND RIGHT ARM  Final   Special Requests Normal  Final   Gram Stain   Final    MODERATE WBC PRESENT, PREDOMINANTLY PMN RARE SQUAMOUS EPITHELIAL CELLS PRESENT MODERATE GRAM POSITIVE COCCI IN PAIRS Performed at [facility]    Culture   Final    MODERATE STREPTOCOCCUS GROUP C Performed at [facility]    Report Status 06/21/2015 FINAL  Final  MRSA PCR Screening     Status: None   Collection Time: 06/19/15 12:25 AM  Result Value Ref Range Status   MRSA by PCR NEGATIVE NEGATIVE Final    Comment:        The GeneXpert MRSA Assay (FDA approved for NASAL specimens only), is one component of a comprehensive MRSA colonization surveillance program. It is not intended to diagnose MRSA infection nor to guide or monitor treatment for MRSA infections.   Anaerobic culture     Status: None (Preliminary result)   Collection Time:  06/20/15  7:57 AM  Result Value Ref Range Status   Specimen Description ABSCESS  Final   Special Requests RIGHT ELBOW  Final  Gram Stain   Final    NO WBC SEEN NO SQUAMOUS EPITHELIAL CELLS SEEN NO ORGANISMS SEEN Performed at [facility]    Culture   Final    NO ANAEROBES ISOLATED; CULTURE IN PROGRESS FOR 5 DAYS Performed at [facility]    Report Status PENDING  Incomplete  Culture, routine-abscess     Status: None (Preliminary result)   Collection Time: 06/20/15  7:57 AM  Result Value Ref Range Status   Specimen Description ABSCESS  Final   Special Requests RIGHT ELBOW  Final   Gram Stain   Final    NO WBC SEEN NO SQUAMOUS EPITHELIAL CELLS SEEN NO ORGANISMS SEEN Performed at [facility]    Culture   Final    NO GROWTH 2 DAYS Performed at [facility]    Report Status PENDING  Incomplete     Scheduled Meds: . ampicillin-sulbactam (UNASYN) IV  3 g Intravenous Q6H  . benzonatate  200 mg Oral TID  . folic acid  1 mg Oral Daily  . multivitamin with minerals  1 tablet Oral Daily  . nicotine  21 mg Transdermal Daily  . predniSONE  40 mg Oral QAC breakfast  . thiamine  100 mg Oral Daily   Continuous Infusions:    Sylvain Hasten, DO  [facility] Pager [phone]  If 7PM-7AM, please contact night-coverage [email or web address] Password [email or web address] 06/23/2015, 9:42 AM   LOS: 5 days

## 2015-06-23 NOTE — Progress Notes (Signed)
CSW met with pt to discuss current substance use.  Pt states that he was a heroine user but has been clean for 2 weeks- states they he feels he is able to stop on his own.  CSW inquired about current alcohol use- pt states that is has been difficult to quit drinking at the same time as stopping heroine use but is hopeful to cut back on his drinking as well.  Pt has family at bedside- pt stated that I could speak with him in front of family- family reports being very supportive and is helping with his efforts to quit.  CSW provided outpatient and inpatient rehab information but pt states he will likely attempt to quit on his own first.  CSW signing off.  Domenica Reamer, [place] Social Worker [phone]

## 2015-06-23 NOTE — Progress Notes (Signed)
Placed pt. On telemetry box 5W [id number] and central telemetry notified.  Will continue to monitor.  Forbes Cellar, RN

## 2015-06-24 DIAGNOSIS — L03114 Cellulitis of left upper limb: Secondary | ICD-10-CM

## 2015-06-24 DIAGNOSIS — F10231 Alcohol dependence with withdrawal delirium: Secondary | ICD-10-CM

## 2015-06-24 LAB — CULTURE, ROUTINE-ABSCESS
CULTURE: NO GROWTH
Gram Stain: NONE SEEN

## 2015-06-24 LAB — BASIC METABOLIC PANEL
ANION GAP: 8 (ref 5–15)
BUN: 9 mg/dL (ref 6–20)
CHLORIDE: 105 mmol/L (ref 101–111)
CO2: 27 mmol/L (ref 22–32)
Calcium: 8.6 mg/dL — ABNORMAL LOW (ref 8.9–10.3)
Creatinine, Ser: 0.63 mg/dL (ref 0.61–1.24)
GFR calc Af Amer: >60 mL/min (ref >60)
GLUCOSE: 79 mg/dL (ref 65–99)
POTASSIUM: 3.8 mmol/L (ref 3.5–5.1)
Sodium: 140 mmol/L (ref 135–145)

## 2015-06-24 LAB — CBC
HEMATOCRIT: 39.6 % (ref 39.0–52.0)
HEMOGLOBIN: 13.9 g/dL (ref 13.0–17.0)
MCH: 33.3 pg (ref 26.0–34.0)
MCHC: 35.1 g/dL (ref 30.0–36.0)
MCV: 95 fL (ref 78.0–100.0)
PLATELETS: 308 10*3/uL (ref 150–400)
RBC: 4.17 MIL/uL — AB (ref 4.22–5.81)
RDW: 12.5 % (ref 11.5–15.5)
WBC: 11.8 10*3/uL — AB (ref 4.0–10.5)

## 2015-06-24 MED ORDER — OXYCODONE-ACETAMINOPHEN 5-325 MG PO TABS: 1.0000 | ORAL_TABLET | ORAL | Status: DC | PRN

## 2015-06-24 MED ORDER — AMOXICILLIN-POT CLAVULANATE 875-125 MG PO TABS: 1.0000 | ORAL_TABLET | Freq: Two times a day (BID) | ORAL | Status: DC

## 2015-06-24 NOTE — Care Management Note (Addendum)
Case Management Note  Patient Details  Name: Bryce Hughes MRN: [id number] Date of Birth: [birth year]  Subjective/Objective:  Patient is for dc today, apt scheduled for hospital follow up [facility] because [facility] full, but patient can still go get his meds there since he has follow up at [facility].  NCM notified MD to print off script so patient can take to [facility] to pick up.  Patient has transportation at dc.  Patient's father will help patient with dressing changes at home.               Action/Plan:   Expected Discharge Date:                  Expected Discharge Plan:  Home/Self Care  In-House Referral:     Discharge planning Services  CM Consult  Post Acute Care Choice:    Choice offered to:     DME Arranged:    DME Agency:     HH Arranged:    HH Agency:     Status of Service:  Completed, signed off  Medicare Important Message Given:    Date Medicare IM Given:    Medicare IM give by:    Date Additional Medicare IM Given:    Additional Medicare Important Message give by:     If discussed at Long Length of Stay Meetings, dates discussed:    Additional Comments:  Leone Haven, RN 06/24/2015, 11:06 AM

## 2015-06-24 NOTE — Discharge Summary (Signed)
Physician Discharge Summary  Bryce Hughes ZOX:[id number] DOB: 12-Mar-1991 DOA: 06/18/2015  PCP: No primary care provider on file.  Admit date: 06/18/2015 Discharge date: 06/24/2015  Recommendations for Outpatient Follow-up:  1. Pt will need to follow up with PCP in 2 weeks post discharge    Discharge Diagnoses:  #1 sepsis secondary to right antecubital fossa cellulitis and abscesses  -Secondary to IVDU.  - Improvement with cellulitis with decreased erythema. MRI of the elbow with a 2 x 0.7 x 2.1 cm fluid collection in the subcutaneous fat anteriorly at the level of the antecubital fossa consistent with abscess. Smaller fluid collections extend deeper but remains superficial to the antecubital vasculature.  -"Wound" cultures with Streptococcus group C. Cultures were taken in the operating room during I and D which are pending.  -Blood cultures are negative.  -WBC trending down. 2-D echo with EF of 60%, no wall motion abnormalities, no evidence of vegetation seen. -Patient status post incision and drainage of right antecubital fossa multiple abscesses, complicated, adjacent tissue rearrangement right elbow 3 x 2 cm per Dr. Roda Shutters 06/20/2015. intraoperative cultures neg to date  -Continue empiric IV Unasyn. IV vancomycin has been discontinued yesterday per Infectious diseases.  -Per ID if patient continues to improve on Unasyn and no new organisms grow from intraoperative abscess cultures that are not covered by Unasyn will continue patient on Unasyn and discharged on Augmentin to complete a two-week course of antibiotics postoperatively. ID following. Orthopedics following. -Patient will go home with 10 additional days of Augmentin to finish 14 days of therapy from the day of surgery -Patient will follow up with Dr. Judie Petit. Gershon Mussel in one week -for pain--percocet 5/325, #20, one po q 4 hours prn pain, no RF  #2 right antecubital fossa abscess/cellulitis right antecubital fossa Secondary  to IV drug use. MRI of the elbow with a 2 x 0.7 x 2.1 cm fluid collection in the subcutaneous fat anteriorly at the level of the antecubital fossa consistent with abscess. Smaller fluid collections extend deeper but remains superficial to the antecubital vasculature. Wound cultures growing Streptococcus group C. Cultures were drawn after I and D done per ortho in OR which are pending. Blood cultures are pending. 2-D echo with EF of 60%, no wall motion abnormalities, no vegetation seen. Patient status post incision and drainage of right antecubital fossa multiple abscesses, complicated, adjacent tissue rearrangement right elbow 3 x 2 cm per Dr. Roda Shutters 06/20/2015. Intraoperative cultures pending.Continue empiric IV Unasyn. IV vancomycin was discontinued yesterday per infectious diseases. Per ID if patient continues to improve on Unasyn and no new organisms grow from intraoperative abscess cultures that are not covered by Unasyn will continue patient on Unasyn and discharged on Augmentin to complete a two-week course of antibiotics postoperatively. ID following. Orthopedics following.PT/OT.  #3 hypokalemia Repleted.  #4 polysubstance abuse/IVDU Patient states last use was 2 weeks prior to admission. UDS was positive for opiates and benzos. Patient admonished on polysubstance use. Social work consultation was placed and pt was given resources   #5 alcohol abuse/ETOH withdrawal delirium Continue the Ativan withdrawal protocol. Continue folic acid. Thiamine. -CIWA score <4 for >24 hours prior to d/c  #6 acute encephalopathy Secondary to alcohol withdrawal and possibly opiate withdrawal.  -Clinically improved and stable without ativan >24 hours prior to d/c   #7 tobacco abuse -Tobacco cessation. Continue nicotine patch.  #8 asthma versus COPD with acute exacerbation Clinical improvement. Continue nebulizer treatments, Tessalon Perles, empiric IV antibiotics, prednisone po x 3 days given -  no wheeze, no  sob, no hypoxemia on day of dc  #9 prophylaxis SCDs for DVT prophylaxis.    Code Status: Full Family Communication: Updated father bedside   Consultants: Orthopedics: Dr. Roda Shutters 06/18/2015 ID consultation: Dr Daiva Eves 06/19/15  Procedures:  MRI right elbow 06/19/2015  Plain films of the orbits 06/19/2015 1. Incision and drainage of right antecubital fossa multiple abscesses, complicated 2. Adjacent tissue rearrangement right elbow 3 x 2 cm--Per Dr Roda Shutters 06/19/15  Discharge Condition: stable  Disposition:  Follow-up Information    Follow up with Cheral Almas, MD In 1 week.   Specialty:  Orthopedic Surgery   Why:  For wound re-check// Appointment with Dr. Roda Shutters is on 8/25/156 at 10:45   Contact information:   [address] [place] [address] [phone]       Diet:regular Wt Readings from Last 3 Encounters:  06/19/15 78.1 kg (172 lb 2.9 oz)  04/18/15 74.2 kg (163 lb 9.3 oz)    History of present illness:  Bryce Hughes is a 24 y.o. male with a history of Polysubstance abuse and Alcohol Abuse who presented to the ED with complaints of fevers and chills since the AM and increasing redness and swelling of his right arm. A pustule developed 1 week ago in the antecubital fossa and his arm continued to redden. He reported having a fever to 101. He has a history of IV Drug abuse, and reports that he crushes Opana Tablets and mixes with tap water and injects. He reported that he last injected over 2 weeks ago and is trying to stop. He states that he drinks a fifth of Vodka daily and last drank a 4 pm on the day of admission.Patient overdosed a few months ago and needed CPR.  Patient was admitted to the step down unit with sepsis secondary to right antecubital fossa cellulitis and abscess. Wound cultures were obtained in the ED and patient placed empirically on IV vancomycin and Zosyn initially and subsequently switched from Zosyn to IV Unasyn. Orthopedics was  consulted patient underwent I and D in the OR and intraoperative cultures were obtained which are pending today. ID was also consulted and followed the patient throughout the hospitalization. Patient has been pancultured. IV vancomycin has been discontinued and patient currently on IV Unasyn while awaiting intraoperative cultures. 2-D echo was obtained which was negative for vegetations. Patient also noted to go into ETOH and Opiate withdrawal and was placed on the Ativan withdrawal protocoland clonidine detox protocol. Patient's withdrawal symptoms have improved.   Consultants: Ortho--Dr. Roda Shutters ID  Discharge Exam: Filed Vitals:   06/24/15 0840  BP: 120/55  Pulse: 58  Temp: 98.2 F (36.8 C)  Resp:    Filed Vitals:   06/24/15 0156 06/24/15 0400 06/24/15 0525 06/24/15 0840  BP: 123/63  115/58 120/55  Pulse: 79 76 66 58  Temp: 98.1 F (36.7 C)  98 F (36.7 C) 98.2 F (36.8 C)  TempSrc: Oral  Oral Oral  Resp: 18  16   Height:      Weight:      SpO2: 98%  100% 98%   General: A&O x 3, NAD, pleasant, cooperative Cardiovascular: RRR, no rub, no gallop, no S3 Respiratory: CTAB, no wheeze, no rhonchi Abdomen:soft, nontender, nondistended, positive bowel sounds Extremities: Left arm with some mild edema. No lymphangitis or necrosis. Serosanguineous drainage minimal. Discharge Instructions      Discharge Instructions    Diet - low sodium heart healthy    Complete  by:  As directed      Increase activity slowly    Complete by:  As directed             Medication List    TAKE these medications        ALPRAZolam 0.5 MG tablet  Commonly known as:  XANAX  Take 0.5 mg by mouth at bedtime as needed for anxiety.     amoxicillin-clavulanate 875-125 MG per tablet  Commonly known as:  AUGMENTIN  Take 1 tablet by mouth 2 (two) times daily.     oxyCODONE-acetaminophen 5-325 MG per tablet  Commonly known as:  ROXICET  Take 1 tablet by mouth every 4 (four) hours as needed for severe  pain.         The results of significant diagnostics from this hospitalization (including imaging, microbiology, ancillary and laboratory) are listed below for reference.    Significant Diagnostic Studies: Dg Eye Foreign Body  06/19/2015   CLINICAL DATA:  Metal working/exposure; clearance prior to MRI  EXAM: ORBITS FOR FOREIGN BODY - 2 VIEW  COMPARISON:  None.  FINDINGS: There is no evidence of metallic foreign body within the orbits. No significant bone abnormality identified.  IMPRESSION: No evidence of metallic foreign body within the orbits.   Electronically Signed   By: Lupita Raider, M.D.   On: 06/19/2015 07:02   Dg Elbow Complete Right  06/18/2015   CLINICAL DATA:  Right elbow abscess.  Infection for 1 week.  EXAM: RIGHT ELBOW - COMPLETE 3+ VIEW  COMPARISON:  None.  FINDINGS: Extensive soft tissue swelling is present within the antecubital fossa. There is gas within the soft tissue as well along the medial aspect of the elbow. The elbow is intact.  IMPRESSION: 1. Soft tissue swelling gas along the medial aspect of the left elbow concerning for infection and developing abscess.   Electronically Signed   By: Marin Roberts M.D.   On: 06/18/2015 23:05   Mr Elbow Right W Wo Contrast  06/19/2015   CLINICAL DATA:  24 y.o. male who presents with right elbow pain and swelling for a couple of days. Has had fever of 101. Uses IV opana and last use was 2 weeks ago. Also an alcoholic and drinks 1 fifth of vodka a day. Patient overdosed a few months ago and needed CPR. Pain is worse with any movement of the elbow. Does not radiate. Pain is severe.  EXAM: MRI OF THE RIGHT ELBOW WITHOUT AND WITH CONTRAST  TECHNIQUE: Multiplanar, multisequence MR imaging of the elbow was performed before and after the administration of intravenous contrast.  CONTRAST:  15mL MULTIHANCE GADOBENATE DIMEGLUMINE 529 MG/ML IV SOLN  COMPARISON:  None.  FINDINGS: TENDONS  Common forearm flexor origin: Intact.  Common forearm  extensor origin: Intact.  Biceps: Intact.  Triceps: Intact.  LIGAMENTS  Medial stabilizers: Intact.  Lateral stabilizers:  Intact.  Cartilage: No chondral defect.  Joint: No joint effusion.  No synovitis.  Cubital tunnel: Normal cubital tunnel.  Bones: No marrow signal abnormality. No fracture or dislocation. No bone destruction or periosteal reaction.  Soft tissue: There is circumferential soft tissue edema within the subcutaneous fat around the elbow joint most severe anteriorly. There is a 2 x 0.7 x 2.1 cm fluid collection in the subcutaneous fat anteriorly at the level of the antecubital fossa consistent with an abscess. There are smaller fluid collections which extend deeper the remain superficial to the antecubital vasculature. There is mild edema in the anterior peripheral  brachioradialis and brachialis muscles without enhancement likely reflecting reactive changes secondary to adjacent inflammation.  IMPRESSION: 1. Cellulitis surrounding the right elbow bow severe anteriorly. There is a 2 x 0.7 x 2.1 cm fluid collection in the subcutaneous fat anteriorly at the level of the antecubital fossa consistent with an abscess. There are smaller fluid collections which extend deeper the remain superficial to the antecubital vasculature.   Electronically Signed   By: Elige Ko   On: 06/19/2015 08:38   Dg Chest Port 1 View  06/18/2015   CLINICAL DATA:  Fever, cough, wheezing, right arm infection  EXAM: PORTABLE CHEST - 1 VIEW  COMPARISON:  04/17/2015  FINDINGS: Lungs are clear.  No pleural effusion or pneumothorax.  The heart is normal in size.  IMPRESSION: No evidence of acute cardiopulmonary disease.   Electronically Signed   By: Charline Bills M.D.   On: 06/18/2015 19:36     Microbiology: Recent Results (from the past 240 hour(s))  Blood Culture (routine x 2)     Status: None   Collection Time: 06/18/15  7:34 PM  Result Value Ref Range Status   Specimen Description BLOOD LEFT ARM  Final   Special  Requests BOTTLES DRAWN AEROBIC AND ANAEROBIC  Final   Culture NO GROWTH 5 DAYS  Final   Report Status 06/23/2015 FINAL  Final  Urine culture     Status: None   Collection Time: 06/18/15  7:46 PM  Result Value Ref Range Status   Specimen Description URINE, CLEAN CATCH  Final   Special Requests NONE  Final   Culture 4,000 COLONIES/mL INSIGNIFICANT GROWTH  Final   Report Status 06/20/2015 FINAL  Final  Blood Culture (routine x 2)     Status: None   Collection Time: 06/18/15  8:39 PM  Result Value Ref Range Status   Specimen Description BLOOD LEFT HAND  Final   Special Requests BOTTLES DRAWN AEROBIC AND ANAEROBIC  Final   Culture NO GROWTH 5 DAYS  Final   Report Status 06/23/2015 FINAL  Final  Wound culture     Status: None   Collection Time: 06/18/15 10:17 PM  Result Value Ref Range Status   Specimen Description WOUND RIGHT ARM  Final   Special Requests Normal  Final   Gram Stain   Final    MODERATE WBC PRESENT, PREDOMINANTLY PMN RARE SQUAMOUS EPITHELIAL CELLS PRESENT MODERATE GRAM POSITIVE COCCI IN PAIRS Performed at [facility]    Culture   Final    MODERATE STREPTOCOCCUS GROUP C Performed at [facility]    Report Status 06/21/2015 FINAL  Final  MRSA PCR Screening     Status: None   Collection Time: 06/19/15 12:25 AM  Result Value Ref Range Status   MRSA by PCR NEGATIVE NEGATIVE Final    Comment:        The GeneXpert MRSA Assay (FDA approved for NASAL specimens only), is one component of a comprehensive MRSA colonization surveillance program. It is not intended to diagnose MRSA infection nor to guide or monitor treatment for MRSA infections.   Anaerobic culture     Status: None (Preliminary result)   Collection Time: 06/20/15  7:57 AM  Result Value Ref Range Status   Specimen Description ABSCESS  Final   Special Requests RIGHT ELBOW  Final   Gram Stain   Final    NO WBC SEEN NO SQUAMOUS EPITHELIAL CELLS SEEN NO ORGANISMS SEEN Performed  at [facility]  Final    NO ANAEROBES ISOLATED; CULTURE IN PROGRESS FOR 5 DAYS Performed at [facility]    Report Status PENDING  Incomplete  Culture, routine-abscess     Status: None   Collection Time: 06/20/15  7:57 AM  Result Value Ref Range Status   Specimen Description ABSCESS  Final   Special Requests RIGHT ELBOW  Final   Gram Stain   Final    NO WBC SEEN NO SQUAMOUS EPITHELIAL CELLS SEEN NO ORGANISMS SEEN Performed at [facility]    Culture   Final    NO GROWTH 3 DAYS Performed at [facility]    Report Status 06/24/2015 FINAL  Final     Labs: Basic Metabolic Panel:  Recent Labs Lab 06/19/15 0900 06/20/15 0320 06/21/15 0254 06/22/15 0442 06/23/15 0351 06/24/15 0609  NA  --  136 139 138 135 140  K  --  4.0 4.5 4.1 3.9 3.8  CL  --  104 107 106 101 105  CO2  --  GLUCOSE  --  136* 131* 76 86 79  BUN  --  <5* CREATININE  --  0.81 0.73 0.62 0.71 0.63  CALCIUM  --  8.8* 8.8* 8.4* 8.4* 8.6*  MG 1.6* 2.1  --  2.0 2.1  --    Liver Function Tests:  Recent Labs Lab 06/18/15 1934 06/20/15 0320  AST 19 26  ALT 12* 13*  ALKPHOS 80 65  BILITOT 0.9 0.5  PROT 7.5 6.9  ALBUMIN 3.7 3.2*   No results for input(s): LIPASE, AMYLASE in the last 168 hours. No results for input(s): AMMONIA in the last 168 hours. CBC:  Recent Labs Lab 06/18/15 1934  06/20/15 0320 06/21/15 0254 06/22/15 0730 06/23/15 0351 06/24/15 0609  WBC 19.5*  < > 10.7* 11.4* 6.5 12.1* 11.8*  NEUTROABS 15.3*  --  10.0*  --   --   --   --   HGB 16.3  < > 13.0 13.0 14.0 14.7 13.9  HCT 45.2  < > 37.9* 38.0* 40.3 42.2 39.6  MCV 95.2  < > 95.7 98.7 96.2 95.9 95.0  PLT 139*  < > 157 195 257 288 308  < > = values in this interval not displayed. Cardiac Enzymes: No results for input(s): CKTOTAL, CKMB, CKMBINDEX, TROPONINI in the last 168 hours. BNP: Invalid input(s): POCBNP CBG: No results for input(s): GLUCAP in the  last 168 hours.  Time coordinating discharge:  Greater than 30 minutes  Signed:  Maleni Seyer, DO [facility] Pager: [phone] 06/24/2015, 10:24 AM

## 2015-06-24 NOTE — Progress Notes (Signed)
Levon Hedger discharged Home with father per MD order.  Discharge instructions reviewed and discussed with the patient, all questions and concerns answered. Copy of instructions, care notes for new medications & diagnosis and scripts given to patient.    Medication List    TAKE these medications        ALPRAZolam 0.5 MG tablet  Commonly known as:  XANAX  Take 0.5 mg by mouth at bedtime as needed for anxiety.     amoxicillin-clavulanate 875-125 MG per tablet  Commonly known as:  AUGMENTIN  Take 1 tablet by mouth 2 (two) times daily.     oxyCODONE-acetaminophen 5-325 MG per tablet  Commonly known as:  ROXICET  Take 1 tablet by mouth every 4 (four) hours as needed for severe pain.        Patients skin is clean, dry and dsg to right arm changed with sutures intact and split replaced. IV site discontinued and catheter remains intact. Site without signs and symptoms of complications. Dressing and pressure applied.  Patient escorted to car by NT/volunteer in a wheelchair,  no distress noted upon discharge.  Laural Benes, Kaley Jutras C 06/24/2015 1:26 PM

## 2015-06-25 LAB — ANAEROBIC CULTURE: GRAM STAIN: NONE SEEN

## 2015-06-25 LAB — HIV-1 RNA QUANT-NO REFLEX-BLD: LOG10 HIV-1 RNA: UNDETERMINED log10copy/mL

## 2015-07-06 ENCOUNTER — Ambulatory Visit: Payer: Self-pay | Admitting: Family Medicine

## 2015-07-09 ENCOUNTER — Ambulatory Visit: Payer: Self-pay | Admitting: Family Medicine

## 2016-06-19 ENCOUNTER — Encounter (HOSPITAL_COMMUNITY): Payer: Self-pay | Admitting: *Deleted

## 2016-06-19 ENCOUNTER — Inpatient Hospital Stay (HOSPITAL_COMMUNITY)
Admission: EM | Admit: 2016-06-19 | Discharge: 2016-06-21 | DRG: 871 | Disposition: A | Discharge status: Home / Self Care | Payer: Medicaid Other | Attending: Family Medicine | Admitting: Family Medicine

## 2016-06-19 ENCOUNTER — Observation Stay (HOSPITAL_COMMUNITY): Payer: Medicaid Other

## 2016-06-19 ENCOUNTER — Emergency Department (HOSPITAL_COMMUNITY): Payer: Medicaid Other

## 2016-06-19 DIAGNOSIS — G92 Toxic encephalopathy: Secondary | ICD-10-CM | POA: Diagnosis present

## 2016-06-19 DIAGNOSIS — J690 Pneumonitis due to inhalation of food and vomit: Secondary | ICD-10-CM | POA: Diagnosis present

## 2016-06-19 DIAGNOSIS — F17210 Nicotine dependence, cigarettes, uncomplicated: Secondary | ICD-10-CM | POA: Diagnosis present

## 2016-06-19 DIAGNOSIS — M6282 Rhabdomyolysis: Secondary | ICD-10-CM | POA: Diagnosis present

## 2016-06-19 DIAGNOSIS — F1010 Alcohol abuse, uncomplicated: Secondary | ICD-10-CM | POA: Diagnosis present

## 2016-06-19 DIAGNOSIS — R4182 Altered mental status, unspecified: Secondary | ICD-10-CM | POA: Diagnosis not present

## 2016-06-19 DIAGNOSIS — E872 Acidosis, unspecified: Secondary | ICD-10-CM | POA: Diagnosis present

## 2016-06-19 DIAGNOSIS — N179 Acute kidney failure, unspecified: Secondary | ICD-10-CM | POA: Diagnosis present

## 2016-06-19 DIAGNOSIS — T401X1A Poisoning by heroin, accidental (unintentional), initial encounter: Secondary | ICD-10-CM | POA: Diagnosis present

## 2016-06-19 DIAGNOSIS — G9340 Encephalopathy, unspecified: Secondary | ICD-10-CM | POA: Diagnosis present

## 2016-06-19 DIAGNOSIS — A419 Sepsis, unspecified organism: Principal | ICD-10-CM | POA: Diagnosis present

## 2016-06-19 DIAGNOSIS — J449 Chronic obstructive pulmonary disease, unspecified: Secondary | ICD-10-CM | POA: Diagnosis present

## 2016-06-19 DIAGNOSIS — R739 Hyperglycemia, unspecified: Secondary | ICD-10-CM | POA: Diagnosis present

## 2016-06-19 DIAGNOSIS — F1910 Other psychoactive substance abuse, uncomplicated: Secondary | ICD-10-CM | POA: Diagnosis present

## 2016-06-19 DIAGNOSIS — R000 Tachycardia, unspecified: Secondary | ICD-10-CM

## 2016-06-19 DIAGNOSIS — D72829 Elevated white blood cell count, unspecified: Secondary | ICD-10-CM

## 2016-06-19 HISTORY — DX: Anxiety disorder, unspecified: F41.9

## 2016-06-19 HISTORY — DX: Unspecified asthma, uncomplicated: J45.909

## 2016-06-19 HISTORY — DX: Unspecified convulsions: R56.9

## 2016-06-19 HISTORY — DX: Other psychoactive substance abuse, uncomplicated: F19.10

## 2016-06-19 LAB — CBC WITH DIFFERENTIAL/PLATELET
BASOS ABS: 0 10*3/uL (ref 0.0–0.1)
Basophils Relative: 0 %
EOS ABS: 0 10*3/uL (ref 0.0–0.7)
Eosinophils Relative: 0 %
HCT: 47.2 % (ref 39.0–52.0)
HEMOGLOBIN: 15.8 g/dL (ref 13.0–17.0)
LYMPHS ABS: 1.2 10*3/uL (ref 0.7–4.0)
LYMPHS PCT: 5 %
MCH: 30.8 pg (ref 26.0–34.0)
MCHC: 33.5 g/dL (ref 30.0–36.0)
MCV: 92 fL (ref 78.0–100.0)
Monocytes Absolute: 1 10*3/uL (ref 0.1–1.0)
Monocytes Relative: 4 %
NEUTROS PCT: 91 %
Neutro Abs: 20.9 10*3/uL — ABNORMAL HIGH (ref 1.7–7.7)
PLATELETS: 252 10*3/uL (ref 150–400)
RBC: 5.13 MIL/uL (ref 4.22–5.81)
RDW: 12.6 % (ref 11.5–15.5)
WBC: 23.1 10*3/uL — AB (ref 4.0–10.5)

## 2016-06-19 LAB — COMPREHENSIVE METABOLIC PANEL
ALK PHOS: 74 U/L (ref 38–126)
ALT: 31 U/L (ref 17–63)
AST: 45 U/L — AB (ref 15–41)
Albumin: 4.2 g/dL (ref 3.5–5.0)
Anion gap: 14 (ref 5–15)
BUN: 16 mg/dL (ref 6–20)
CALCIUM: 9.6 mg/dL (ref 8.9–10.3)
CHLORIDE: 104 mmol/L (ref 101–111)
CO2: 22 mmol/L (ref 22–32)
CREATININE: 1.38 mg/dL — AB (ref 0.61–1.24)
GFR calc non Af Amer: >60 mL/min (ref >60)
Glucose, Bld: 139 mg/dL — ABNORMAL HIGH (ref 65–99)
Potassium: 4.1 mmol/L (ref 3.5–5.1)
SODIUM: 140 mmol/L (ref 135–145)
Total Bilirubin: 0.3 mg/dL (ref 0.3–1.2)
Total Protein: 7.3 g/dL (ref 6.5–8.1)

## 2016-06-19 LAB — RAPID URINE DRUG SCREEN, HOSP PERFORMED
AMPHETAMINES: NOT DETECTED
BARBITURATES: NOT DETECTED
BENZODIAZEPINES: POSITIVE — AB
COCAINE: POSITIVE — AB
Opiates: POSITIVE — AB
TETRAHYDROCANNABINOL: POSITIVE — AB

## 2016-06-19 LAB — URINE MICROSCOPIC-ADD ON: RBC / HPF: NONE SEEN RBC/hpf (ref 0–5)

## 2016-06-19 LAB — PROTIME-INR
INR: 1.21
Prothrombin Time: 15.4 seconds — ABNORMAL HIGH (ref 11.4–15.2)

## 2016-06-19 LAB — URINALYSIS, ROUTINE W REFLEX MICROSCOPIC
BILIRUBIN URINE: NEGATIVE
GLUCOSE, UA: 500 mg/dL — AB
HGB URINE DIPSTICK: NEGATIVE
KETONES UR: NEGATIVE mg/dL
Leukocytes, UA: NEGATIVE
Nitrite: NEGATIVE
PROTEIN: 30 mg/dL — AB
Specific Gravity, Urine: 1.025 (ref 1.005–1.030)
pH: 6 (ref 5.0–8.0)

## 2016-06-19 LAB — ETHANOL: Alcohol, Ethyl (B): <5 mg/dL (ref <5)

## 2016-06-19 LAB — LACTIC ACID, PLASMA
LACTIC ACID, VENOUS: 0.9 mmol/L (ref 0.5–1.9)
Lactic Acid, Venous: 2.5 mmol/L (ref 0.5–1.9)

## 2016-06-19 LAB — APTT: APTT: 33 s (ref 24–36)

## 2016-06-19 LAB — I-STAT CG4 LACTIC ACID, ED: LACTIC ACID, VENOUS: 3.34 mmol/L — AB (ref 0.5–1.9)

## 2016-06-19 LAB — PROCALCITONIN: PROCALCITONIN: 1.51 ng/mL

## 2016-06-19 LAB — STREP PNEUMONIAE URINARY ANTIGEN: Strep Pneumo Urinary Antigen: NEGATIVE

## 2016-06-19 LAB — SALICYLATE LEVEL

## 2016-06-19 LAB — MRSA PCR SCREENING: MRSA by PCR: NEGATIVE

## 2016-06-19 LAB — CK: Total CK: 153 U/L (ref 49–397)

## 2016-06-19 LAB — ACETAMINOPHEN LEVEL: Acetaminophen (Tylenol), Serum: <10 ug/mL — ABNORMAL LOW (ref 10–30)

## 2016-06-19 LAB — HIV ANTIBODY (ROUTINE TESTING W REFLEX): HIV SCREEN 4TH GENERATION: NONREACTIVE

## 2016-06-19 MED ORDER — DEXTROSE 5 % IV SOLN: 1.0000 g | INTRAVENOUS | Status: DC

## 2016-06-19 MED ORDER — FOLIC ACID 1 MG PO TABS
1.0000 mg | ORAL_TABLET | Freq: Every day | ORAL | Status: DC
  Administered 2016-06-19 – 2016-06-21 (×3): 1 mg via ORAL
  Filled 2016-06-19 (×3): qty 1

## 2016-06-19 MED ORDER — ACETAMINOPHEN 325 MG PO TABS
650.0000 mg | ORAL_TABLET | Freq: Four times a day (QID) | ORAL | Status: DC | PRN
  Administered 2016-06-19: 650 mg via ORAL
  Filled 2016-06-19: qty 2

## 2016-06-19 MED ORDER — DEXTROSE 5 % IV SOLN
500.0000 mg | Freq: Once | INTRAVENOUS | Status: DC
  Administered 2016-06-19: 500 mg via INTRAVENOUS
  Filled 2016-06-19: qty 500

## 2016-06-19 MED ORDER — ADULT MULTIVITAMIN W/MINERALS CH
1.0000 | ORAL_TABLET | Freq: Every day | ORAL | Status: DC
  Administered 2016-06-19 – 2016-06-21 (×3): 1 via ORAL
  Filled 2016-06-19 (×3): qty 1

## 2016-06-19 MED ORDER — LORAZEPAM 1 MG PO TABS
1.0000 mg | ORAL_TABLET | Freq: Four times a day (QID) | ORAL | Status: DC | PRN
  Administered 2016-06-20: 1 mg via ORAL
  Filled 2016-06-19 (×2): qty 1

## 2016-06-19 MED ORDER — SODIUM CHLORIDE 0.9 % IV SOLN
3.0000 g | Freq: Four times a day (QID) | INTRAVENOUS | Status: DC
  Administered 2016-06-19 – 2016-06-21 (×9): 3 g via INTRAVENOUS
  Filled 2016-06-19 (×12): qty 3

## 2016-06-19 MED ORDER — SODIUM CHLORIDE 0.9 % IV BOLUS (SEPSIS)
1000.0000 mL | Freq: Once | INTRAVENOUS | Status: AC
  Administered 2016-06-19: 1000 mL via INTRAVENOUS

## 2016-06-19 MED ORDER — THIAMINE HCL 100 MG/ML IJ SOLN: 100.0000 mg | Freq: Every day | INTRAMUSCULAR | Status: DC

## 2016-06-19 MED ORDER — LORAZEPAM 2 MG/ML IJ SOLN
1.0000 mg | Freq: Four times a day (QID) | INTRAMUSCULAR | Status: DC | PRN
  Administered 2016-06-19: 1 mg via INTRAVENOUS
  Filled 2016-06-19: qty 1

## 2016-06-19 MED ORDER — VITAMIN B-1 100 MG PO TABS
100.0000 mg | ORAL_TABLET | Freq: Every day | ORAL | Status: DC
  Administered 2016-06-19 – 2016-06-21 (×3): 100 mg via ORAL
  Filled 2016-06-19 (×3): qty 1

## 2016-06-19 MED ORDER — ENOXAPARIN SODIUM 40 MG/0.4ML ~~LOC~~ SOLN
40.0000 mg | SUBCUTANEOUS | Status: DC
  Administered 2016-06-19: 40 mg via SUBCUTANEOUS
  Filled 2016-06-19: qty 0.4

## 2016-06-19 MED ORDER — DEXTROSE 5 % IV SOLN
1.0000 g | INTRAVENOUS | Status: DC
Start: 2016-06-20 — End: 2016-06-19

## 2016-06-19 MED ORDER — DEXTROSE 5 % IV SOLN: 500.0000 mg | INTRAVENOUS | Status: DC

## 2016-06-19 MED ORDER — ACETAMINOPHEN 650 MG RE SUPP: 650.0000 mg | Freq: Four times a day (QID) | RECTAL | Status: DC | PRN

## 2016-06-19 MED ORDER — CEFTRIAXONE SODIUM 1 G IJ SOLR
1.0000 g | Freq: Once | INTRAMUSCULAR | Status: AC
  Administered 2016-06-19: 1 g via INTRAVENOUS
  Filled 2016-06-19: qty 10

## 2016-06-19 MED ORDER — LORAZEPAM 2 MG/ML IJ SOLN
0.0000 mg | Freq: Four times a day (QID) | INTRAMUSCULAR | Status: AC
  Administered 2016-06-19 – 2016-06-21 (×2): 2 mg via INTRAVENOUS
  Filled 2016-06-19 (×2): qty 1

## 2016-06-19 MED ORDER — AZITHROMYCIN 500 MG IV SOLR: 500.0000 mg | INTRAVENOUS | Status: DC

## 2016-06-19 MED ORDER — SODIUM CHLORIDE 0.9 % IV SOLN
INTRAVENOUS | Status: AC
  Administered 2016-06-19: 10:00:00 via INTRAVENOUS

## 2016-06-19 MED ORDER — SODIUM CHLORIDE 0.9% FLUSH
3.0000 mL | Freq: Two times a day (BID) | INTRAVENOUS | Status: DC
  Administered 2016-06-20 – 2016-06-21 (×3): 3 mL via INTRAVENOUS

## 2016-06-19 MED ORDER — LORAZEPAM 2 MG/ML IJ SOLN
0.0000 mg | Freq: Two times a day (BID) | INTRAMUSCULAR | Status: DC
  Administered 2016-06-21: 2 mg via INTRAVENOUS
  Filled 2016-06-19: qty 1

## 2016-06-19 NOTE — Progress Notes (Signed)
Patient arrived in the unit accompanied by two NT via stretcher. Patient's father at bedside. Orientation to the unit given. Patient verbalizes understanding
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
## 2016-06-19 NOTE — ED Notes (Signed)
Notified admitting about abnormal lactic
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
## 2016-06-19 NOTE — ED Triage Notes (Signed)
Pt to ED by EMS after family found pt unresponsive under a porch in their backyard. Pt with shallow breathing and pinpoint pupils initially, NPA placed, 3mg  IM  Given with no response. EMS gave additional 1mg  narcan IV with response to medication. Pt denies drug and alcohol use. Per father, pt uses heroin and is heavy alcoholic. CBG 493 . Pt alert on arrival, drowsy

## 2016-06-19 NOTE — ED Notes (Signed)
Pt removed nasal airway

## 2016-06-19 NOTE — Plan of Care (Signed)
25 year old male with polysubstance abuse including IV drugs and alcohol was found on the porch at his house by his parents unresponsive and was brought to the ER. As per ER physician patient has become more responsive but still drowsy. CT head is pending. Patient has leukocytosis and tachycardia and chest x-ray shows peribronchial thickening with exam showing coarse breath sounds. Patient is started on antibiotics for pneumonia blood cultures have been ordered.  Bryce MiniumArshad Sayde Hughes.

## 2016-06-19 NOTE — Progress Notes (Signed)
Pharmacy Antibiotic Note  Bryce Hughes is a 25 y.o. male admitted on 06/19/2016 with aspiration pneumonia resulting in sepsis. Pharmacy has been consulted for unasyn dosing.  Assessment: 25 y.o. Male patient presents to the ED with SIRS and suspected aspiration pneumonia after suspected drug overdose. WBC elevated at 23.1 and currently afebrile. Will start empiric unasyn at this time.  Plan: Unasyn 3g q6 Monitor c/s, renal function, and clinical s/sx  Height: 5\' 11"  (180.3 cm) Weight: 165 lb (74.8 kg) IBW/kg (Calculated) : 75.3  Temp (24hrs), Avg:98.3 F (36.8 C), Min:98 F (36.7 C), Max:98.5 F (36.9 C)   Recent Labs Lab 06/19/16 0458 06/19/16 0531  WBC 23.1*  --   CREATININE 1.38*  --   LATICACIDVEN  --  3.34*    Estimated Creatinine Clearance: 87.3 mL/min (by C-G formula based on SCr of 1.38 mg/dL).    No Known Allergies  Antimicrobials this admission: Unasyn 3 g q6 8/14>>  Dose adjustments this admission: N/A  Microbiology results: 8/14 BCx: pending  Thank you for allowing pharmacy to be a part of this patient's care.  Ruben Imony Kunta Hilleary, PharmD Clinical Pharmacist Pager: [phone] 06/19/2016 8:39 AM

## 2016-06-19 NOTE — ED Notes (Signed)
Admitting and lab at bedside

## 2016-06-19 NOTE — ED Notes (Signed)
Report given to Kristina RN.

## 2016-06-20 LAB — BASIC METABOLIC PANEL
Anion gap: 3 — ABNORMAL LOW (ref 5–15)
BUN: 8 mg/dL (ref 6–20)
CALCIUM: 8.9 mg/dL (ref 8.9–10.3)
CO2: 30 mmol/L (ref 22–32)
CREATININE: 0.95 mg/dL (ref 0.61–1.24)
Chloride: 108 mmol/L (ref 101–111)
GFR calc Af Amer: >60 mL/min (ref >60)
GFR calc non Af Amer: >60 mL/min (ref >60)
GLUCOSE: 96 mg/dL (ref 65–99)
Potassium: 4.7 mmol/L (ref 3.5–5.1)
Sodium: 141 mmol/L (ref 135–145)

## 2016-06-20 LAB — CBC
HCT: 41.2 % (ref 39.0–52.0)
Hemoglobin: 13.6 g/dL (ref 13.0–17.0)
MCH: 30.3 pg (ref 26.0–34.0)
MCHC: 33 g/dL (ref 30.0–36.0)
MCV: 91.8 fL (ref 78.0–100.0)
PLATELETS: 244 10*3/uL (ref 150–400)
RBC: 4.49 MIL/uL (ref 4.22–5.81)
RDW: 12.9 % (ref 11.5–15.5)
WBC: 8.4 10*3/uL (ref 4.0–10.5)

## 2016-06-20 LAB — HEMOGLOBIN A1C
HEMOGLOBIN A1C: 5.2 % (ref 4.8–5.6)
Mean Plasma Glucose: 103 mg/dL

## 2016-06-20 LAB — CK: CK TOTAL: 185 U/L (ref 49–397)

## 2016-06-20 NOTE — Progress Notes (Signed)
[facility]  PROGRESS NOTE  Bryce HedgerWilliam R Hughes VHQ:[id number] DOB: [birth year] DOA: 06/19/2016 PCP: No PCP Per Patient    Brief HPI:   25 y.o. male with medical history significant for heroin use, polysubstance abuse, tobacco use, previous cellulitis to right elbow from IV drug use presents to the emergency department from home with acute encephalopathy. Initial evaluation reveals sepsis criteria with concern for aspiration pneumonia in the setting of drug overdose. Information is obtained from the chart and the patient. He reports no recollection of circumstances that brought him to the emergency department. He states his last memory is going out onto the front porch to smoke a cigarette. He feels like it was around 3 AM. Chart review indicates he was found on the front porch unresponsive by his family. He was noted to have shallow respirations with pinpoint pupils. EMS was called and provided 3 mg of Narcan with reportedly minimal response. Review indicates they started an IV given additional 1 mg of Narcan intravenously to which she responded. Patient denies any recent illness. He denies recollection of falling. He states he will do heroin when he can get it. Denies any chest pain palpitations headache dizziness syncope or near-syncope. He denies abdominal pain nausea vomiting dysuria hematuria frequency or urgency. He denies any shortness of breath cough fever.     Assessment/Plan:    1. Acute encephalopathy- due to drug overdose, resolved. Urine drug screen positive for benzodiazepine, opiates, cocaine, tetrahydrocannabinol. 2. ? Aspiration pneumonia- patient empirically started on Unasyn, chest x-ray shows no pneumonia. Patient has been afebrile. Blood cultures of negative to date. Follow blood cultures, if negative patient can be discharged on by mouth Augmentin. WBC improved from 22,000  to 8000. 3. Polysubstance abuse- consult, patient says that he will stop using drugs. Does not  want psychiatric evaluation or social work consult to check for resources for drug rehabilitation. Patient was started on CIWA protocol at the time of admission. 4. Acute kidney injury- patient's creatinine was 1.38 on admission, now has improved to 0.95 after IV fluids.   DVT prophylaxis: Lovenox Code Status: Full code Family Communication: Discussed with patient's father bedside Disposition Plan: Pending results of blood cultures, if negative patient can go home on by mouth antibiotics   Consultants:  *None  Procedures:  None  Antibiotics:  Unasyn   Subjective   Patient seen and examined, denies chest pain or shortness of breath.  Objective    Objective: Vitals:   06/20/16 0031 06/20/16 0656 06/20/16 0815 06/20/16 1158  BP: (!) 87/45 101/62 104/61 111/66  Pulse: 70 61 67 73  Resp: 18 18 18 16   Temp: 98.1 F (36.7 C) 98.7 F (37.1 C) 98.1 F (36.7 C) 98.2 F (36.8 C)  TempSrc: Oral Oral Oral Oral  SpO2: 96% 95% 94% 97%  Weight:  78.5 kg (173 lb)    Height:        Intake/Output Summary (Last 24 hours) at 06/20/16 1545 Last data filed at 06/20/16 0817  Gross per 24 hour  Intake              964 ml  Output             2325 ml  Net            -1361 ml   Filed Weights   06/19/16 0438 06/19/16 1150 06/20/16 0656  Weight: 74.8 kg (165 lb) 77.5 kg (170 lb 12.8 oz) 78.5 kg (173 lb)    Examination:  General exam:  Appears calm and comfortable  Respiratory system: Clear to auscultation. Respiratory effort normal. Cardiovascular system: S1 & S2 heard, RRR. No JVD, murmurs, rubs, gallops or clicks. No pedal edema. Gastrointestinal system: Abdomen is nondistended, soft and nontender. No organomegaly or masses felt. Normal bowel sounds heard. Central nervous system: Alert and oriented. No focal neurological deficits. Extremities: Symmetric 5 x 5 power. Skin: No rashes, lesions or ulcers Psychiatry: Judgement and insight appear normal. Mood & affect appropriate.     Data Reviewed: I have personally reviewed following labs and imaging studies Basic Metabolic Panel:  Recent Labs Lab 06/19/16 0458 06/20/16 0407  NA 140 141  K 4.1 4.7  CL 104 108  CO2 22 30  GLUCOSE 139* 96  BUN 16 8  CREATININE 1.38* 0.95  CALCIUM 9.6 8.9   Liver Function Tests:  Recent Labs Lab 06/19/16 0458  AST 45*  ALT 31  ALKPHOS 74  BILITOT 0.3  PROT 7.3  ALBUMIN 4.2   No results for input(s): LIPASE, AMYLASE in the last 168 hours. No results for input(s): AMMONIA in the last 168 hours. CBC:  Recent Labs Lab 06/19/16 0458 06/20/16 0407  WBC 23.1* 8.4  NEUTROABS 20.9*  --   HGB 15.8 13.6  HCT 47.2 41.2  MCV 92.0 91.8  PLT 252 244   Cardiac Enzymes:  Recent Labs Lab 06/19/16 0812 06/20/16 0407  CKTOTAL 153 185   BNP (last 3 results) No results for input(s): BNP in the last 8760 hours.  ProBNP (last 3 results) No results for input(s): PROBNP in the last 8760 hours.  CBG: No results for input(s): GLUCAP in the last 168 hours.  Recent Results (from the past 240 hour(s))  Blood culture (routine x 2)     Status: None (Preliminary result)   Collection Time: 06/19/16  6:39 AM  Result Value Ref Range Status   Specimen Description BLOOD LEFT ANTECUBITAL  Final   Special Requests BOTTLES DRAWN AEROBIC AND ANAEROBIC  5CC  Final   Culture NO GROWTH 1 DAY  Final   Report Status PENDING  Incomplete  Blood culture (routine x 2)     Status: None (Preliminary result)   Collection Time: 06/19/16  6:43 AM  Result Value Ref Range Status   Specimen Description BLOOD LEFT ARM  Final   Special Requests BOTTLES DRAWN AEROBIC AND ANAEROBIC  5CC  Final   Culture NO GROWTH 1 DAY  Final   Report Status PENDING  Incomplete  MRSA PCR Screening     Status: None   Collection Time: 06/19/16  1:23 PM  Result Value Ref Range Status   MRSA by PCR NEGATIVE NEGATIVE Final    Comment:        The GeneXpert MRSA Assay (FDA approved for NASAL specimens only), is  one component of a comprehensive MRSA colonization surveillance program. It is not intended to diagnose MRSA infection nor to guide or monitor treatment for MRSA infections.      Studies: Ct Head Wo Contrast  Result Date: 06/19/2016 CLINICAL DATA:  25 year old male with a history of altered mental status EXAM: CT HEAD WITHOUT CONTRAST TECHNIQUE: Contiguous axial images were obtained from the base of the skull through the vertex without intravenous contrast. COMPARISON:  None. FINDINGS: Unremarkable appearance of the calvarium without acute fracture or aggressive lesion. Unremarkable appearance of the scalp soft tissues. Unremarkable appearance of the bilateral orbits. Mastoid air cells are clear. Trace mucosal disease of the left maxillary sinus. Mild mucosal disease of the ethmoid  air cells. No acute intracranial hemorrhage, midline shift, or mass effect. Gray-white differentiation is maintained, without CT evidence of acute ischemia. Unremarkable configuration of the ventricles. IMPRESSION: No CT evidence of acute intracranial abnormality. Signed, Yvone NeuJaime S. Loreta AveWagner, DO [facility] Electronically Signed   By: Gilmer MorJaime  Wagner D.O.   On: 06/19/2016 07:14   Dg Chest Port 1 View  Result Date: 06/19/2016 CLINICAL DATA:  Cough and altered mental status. Patient was found unresponsive. Shallow breathing and pinpoint pupils. EXAM: PORTABLE CHEST 1 VIEW COMPARISON:  06/18/2015 FINDINGS: Normal heart size and pulmonary vascularity. Peribronchial thickening may indicate small airways disease. No focal consolidation or edema. No blunting of costophrenic angles. No pneumothorax. Mediastinal contours appear intact. IMPRESSION: Peribronchial thickening may indicate small airways disease. No focal consolidation. Electronically Signed   By: Burman NievesWilliam  Stevens M.D.   On: 06/19/2016 05:48    Scheduled Meds: . ampicillin-sulbactam (UNASYN) IV  3 g Intravenous  Q6H  . enoxaparin (LOVENOX) injection  40 mg Subcutaneous Q24H  . folic acid  1 mg Oral Daily  . LORazepam  0-4 mg Intravenous Q6H   Followed by  . [START ON 06/21/2016] LORazepam  0-4 mg Intravenous Q12H  . multivitamin with minerals  1 tablet Oral Daily  . sodium chloride flush  3 mL Intravenous Q12H  . thiamine  100 mg Oral Daily   Continuous Infusions:      Time spent: 20 min    [facility] Center[facility],Edwinna Rochette S  [facility] Pager [phone]. If 7PM-7AM, please contact night-coverage at [email or web address], Office  [phone]  password [email or web address] 06/20/2016, 3:45 PM  LOS: 1 day

## 2016-06-21 DIAGNOSIS — A419 Sepsis, unspecified organism: Principal | ICD-10-CM

## 2016-06-21 DIAGNOSIS — E872 Acidosis: Secondary | ICD-10-CM

## 2016-06-21 DIAGNOSIS — F1010 Alcohol abuse, uncomplicated: Secondary | ICD-10-CM

## 2016-06-21 DIAGNOSIS — R739 Hyperglycemia, unspecified: Secondary | ICD-10-CM

## 2016-06-21 DIAGNOSIS — N179 Acute kidney failure, unspecified: Secondary | ICD-10-CM

## 2016-06-21 DIAGNOSIS — D72829 Elevated white blood cell count, unspecified: Secondary | ICD-10-CM

## 2016-06-21 DIAGNOSIS — R000 Tachycardia, unspecified: Secondary | ICD-10-CM

## 2016-06-21 DIAGNOSIS — F1910 Other psychoactive substance abuse, uncomplicated: Secondary | ICD-10-CM

## 2016-06-21 DIAGNOSIS — G9340 Encephalopathy, unspecified: Secondary | ICD-10-CM

## 2016-06-21 DIAGNOSIS — J690 Pneumonitis due to inhalation of food and vomit: Secondary | ICD-10-CM

## 2016-06-21 LAB — CBC
HCT: 42.4 % (ref 39.0–52.0)
Hemoglobin: 14.4 g/dL (ref 13.0–17.0)
MCH: 30.8 pg (ref 26.0–34.0)
MCHC: 34 g/dL (ref 30.0–36.0)
MCV: 90.6 fL (ref 78.0–100.0)
Platelets: 241 10*3/uL (ref 150–400)
RBC: 4.68 MIL/uL (ref 4.22–5.81)
RDW: 12.9 % (ref 11.5–15.5)
WBC: 7.4 10*3/uL (ref 4.0–10.5)

## 2016-06-21 LAB — BASIC METABOLIC PANEL
Anion gap: 6 (ref 5–15)
BUN: 7 mg/dL (ref 6–20)
CALCIUM: 9.4 mg/dL (ref 8.9–10.3)
CO2: 23 mmol/L (ref 22–32)
CREATININE: 0.74 mg/dL (ref 0.61–1.24)
Chloride: 110 mmol/L (ref 101–111)
GFR calc non Af Amer: >60 mL/min (ref >60)
Glucose, Bld: 90 mg/dL (ref 65–99)
Potassium: 4.4 mmol/L (ref 3.5–5.1)
SODIUM: 139 mmol/L (ref 135–145)

## 2016-06-21 MED ORDER — AMOXICILLIN-POT CLAVULANATE 875-125 MG PO TABS: 1.0000 | ORAL_TABLET | Freq: Two times a day (BID) | ORAL | 0 refills | Status: AC

## 2016-06-21 NOTE — Progress Notes (Signed)
Pt has orders to be discharged. Discharge instructions given and pt has no additional questions at this time. Medication regimen reviewed and pt educated. Pt verbalized understanding and has no additional questions. Telemetry box removed. IV removed and site in good condition. Pt stable and waiting for transportation.  Marycatherine Maniscalco RN 

## 2016-06-21 NOTE — Clinical Social Work Note (Signed)
CSW received substance abuse consult. CSW met with patient at beside to complete assessment. Patient was resting comfortably in bed. CSW completed SBIRT with patient. Patient reported he is concerned about his substance use and plans to seek treatment after discharge. CSW provided patient with information on outpatient and inpatient substance abuse programs. Patient reported he will review information and follow up with an outpatient or inpatient substance abuse program after discharge. CSW signing off.   [employer], LCSW [phone]

## 2016-06-21 NOTE — Discharge Summary (Signed)
Physician Discharge Summary  NAGI FURIO JXB:[id number] DOB: 09-10-1991 DOA: 06/19/2016  PCP: No PCP Per Patient  Admit date: 06/19/2016 Discharge date: 06/21/2016  Admitted From: Home Disposition:  Home  Recommendations for Outpatient Follow-up:  1. Follow up with PCP in 1-2 weeks 2. Please obtain BMP/CBC in one week   Discharge Condition: Stable CODE STATUS: FULL Diet recommendation: Regular   Pt is High Risk for readmission secondary to substance abuse problem and decline to get assistance.    Brief/Interim Summary: Brief HPI:   25 y.o.malewith medical history significant for heroin use, polysubstance abuse, tobacco use, previous cellulitis to right elbow from IV drug use presents to the emergency department from home with acute encephalopathy. Initial evaluation reveals sepsis criteria with concern for aspiration pneumonia in the setting of drug overdose. Information is obtained from the chart and the patient. He reports no recollection of circumstances that brought him to the emergency department. He states his last memory is going out onto the front porch to smoke a cigarette. He feels like it was around 3 AM. Chart review indicates he was found on the front porch unresponsive by his family. He was noted to have shallow respirations with pinpoint pupils. EMS was called and provided 3 mg of Narcan with reportedly minimal response. Review indicates they started an IV given additional 1 mg of Narcan intravenously to which she responded. Patient denies any recent illness. He denies recollection of falling. He states he will do heroinwhen he can get it. Denies any chest pain palpitations headache dizziness syncope or near-syncope. He denies abdominal pain nausea vomiting dysuria hematuria frequency or urgency. He denies any shortness of breath cough fever.   Assessment/Plan:    1. Acute encephalopathy- due to drug overdose, resolved. Urine drug screen positive for  benzodiazepine, opiates, cocaine, tetrahydrocannabinol. 2. Aspiration pneumonia- patient empirically started on Unasyn, chest x-ray shows no pneumonia. Patient has been afebrile. Blood cultures of negative to date. Patient can be discharged on by mouth Augmentin. WBC improved from 22,000  to 8000. 3. Polysubstance abuse- Patient says that he will not stop using drugs. Does not want psychiatric evaluation or social work consult to check for resources for drug rehabilitation. Patient was started on CIWA protocol at the time of admission. 4. Acute kidney injury- patient's creatinine was 1.38 on admission, now has improved to 0.95 after IV fluids.  DVT prophylaxis: Lovenox Code Status: Full code Family Communication: Discussed with patient's father bedside Disposition Plan: Home   Discharge Diagnoses:  Principal Problem:   Sepsis (HCC) Active Problems:   Polysubstance abuse   Alcohol abuse   Acute encephalopathy   Aspiration pneumonia (HCC)   Acute kidney injury (HCC)   Lactic acidosis   Tachycardia   Leukocytosis   Hyperglycemia  Discharge Instructions  Discharge Instructions    Increase activity slowly    Complete by:  As directed       Medication List    STOP taking these medications   oxyCODONE-acetaminophen 5-325 MG tablet Commonly known as:  ROXICET     TAKE these medications   amoxicillin-clavulanate 875-125 MG tablet Commonly known as:  AUGMENTIN Take 1 tablet by mouth 2 (two) times daily. Start taking on:  06/22/2016       No Known Allergies  Procedures/Studies: Ct Head Wo Contrast  Result Date: 06/19/2016 CLINICAL DATA:  25 year old male with a history of altered mental status EXAM: CT HEAD WITHOUT CONTRAST TECHNIQUE: Contiguous axial images were obtained from the base of the skull through  the vertex without intravenous contrast. COMPARISON:  None. FINDINGS: Unremarkable appearance of the calvarium without acute fracture or aggressive lesion. Unremarkable  appearance of the scalp soft tissues. Unremarkable appearance of the bilateral orbits. Mastoid air cells are clear. Trace mucosal disease of the left maxillary sinus. Mild mucosal disease of the ethmoid air cells. No acute intracranial hemorrhage, midline shift, or mass effect. Gray-white differentiation is maintained, without CT evidence of acute ischemia. Unremarkable configuration of the ventricles. IMPRESSION: No CT evidence of acute intracranial abnormality. Signed, Yvone NeuJaime S. Loreta AveWagner, DO [facility] Electronically Signed   By: Gilmer MorJaime  Wagner D.O.   On: 06/19/2016 07:14   Dg Chest Port 1 View  Result Date: 06/19/2016 CLINICAL DATA:  Cough and altered mental status. Patient was found unresponsive. Shallow breathing and pinpoint pupils. EXAM: PORTABLE CHEST 1 VIEW COMPARISON:  06/18/2015 FINDINGS: Normal heart size and pulmonary vascularity. Peribronchial thickening may indicate small airways disease. No focal consolidation or edema. No blunting of costophrenic angles. No pneumothorax. Mediastinal contours appear intact. IMPRESSION: Peribronchial thickening may indicate small airways disease. No focal consolidation. Electronically Signed   By: Burman NievesWilliam  Stevens M.D.   On: 06/19/2016 05:48     Subjective: Pt asking to go home.  He says that he feels much better.    Discharge Exam: Vitals:   06/20/16 2044 06/21/16 0517  BP: 112/62 109/70  Pulse: (!) 58 (!) 58  Resp: 18 18  Temp: 98.5 F (36.9 C) 98.1 F (36.7 C)   Vitals:   06/20/16 0815 06/20/16 1158 06/20/16 2044 06/21/16 0517  BP: 104/61 111/66 112/62 109/70  Pulse: 67 73 (!) 58 (!) 58  Resp: 18 16 18 18   Temp: 98.1 F (36.7 C) 98.2 F (36.8 C) 98.5 F (36.9 C) 98.1 F (36.7 C)  TempSrc: Oral Oral Oral Oral  SpO2: 94% 97% 97% 98%  Weight:    76.7 kg (169 lb 3.2 oz)  Height:       General exam: Appears calm and comfortable  Respiratory system: Clear to auscultation.  Respiratory effort normal. Cardiovascular system: S1 & S2 heard, RRR. No JVD, murmurs, rubs, gallops or clicks. No pedal edema. Gastrointestinal system: Abdomen is nondistended, soft and nontender. No organomegaly or masses felt. Normal bowel sounds heard. Central nervous system: Alert and oriented. No focal neurological deficits. Extremities: Symmetric 5 x 5 power. Skin: No rashes, lesions or ulcers Psychiatry: Judgement and insight poor. Mood & affect appropriate.   The results of significant diagnostics from this hospitalization (including imaging, microbiology, ancillary and laboratory) are listed below for reference.     Microbiology: Recent Results (from the past 240 hour(s))  Blood culture (routine x 2)     Status: None (Preliminary result)   Collection Time: 06/19/16  6:39 AM  Result Value Ref Range Status   Specimen Description BLOOD LEFT ANTECUBITAL  Final   Special Requests BOTTLES DRAWN AEROBIC AND ANAEROBIC  5CC  Final   Culture NO GROWTH 1 DAY  Final   Report Status PENDING  Incomplete  Blood culture (routine x 2)     Status: None (Preliminary result)   Collection Time: 06/19/16  6:43 AM  Result Value Ref Range Status   Specimen Description BLOOD LEFT ARM  Final   Special Requests BOTTLES DRAWN AEROBIC AND ANAEROBIC  5CC  Final   Culture NO GROWTH 1 DAY  Final   Report Status PENDING  Incomplete  MRSA PCR Screening     Status: None   Collection Time:  06/19/16  1:23 PM  Result Value Ref Range Status   MRSA by PCR NEGATIVE NEGATIVE Final    Comment:        The GeneXpert MRSA Assay (FDA approved for NASAL specimens only), is one component of a comprehensive MRSA colonization surveillance program. It is not intended to diagnose MRSA infection nor to guide or monitor treatment for MRSA infections.      Labs: BNP (last 3 results) No results for input(s): BNP in the last 8760 hours. Basic Metabolic Panel:  Recent Labs Lab 06/19/16 0458 06/20/16 0407  06/21/16 0457  NA 140 141 139  K 4.1 4.7 4.4  CL 104 108 110  CO2 22 30 23   GLUCOSE 139* 96 90  BUN 16 8 7   CREATININE 1.38* 0.95 0.74  CALCIUM 9.6 8.9 9.4   Liver Function Tests:  Recent Labs Lab 06/19/16 0458  AST 45*  ALT 31  ALKPHOS 74  BILITOT 0.3  PROT 7.3  ALBUMIN 4.2   No results for input(s): LIPASE, AMYLASE in the last 168 hours. No results for input(s): AMMONIA in the last 168 hours. CBC:  Recent Labs Lab 06/19/16 0458 06/20/16 0407 06/21/16 0457  WBC 23.1* 8.4 7.4  NEUTROABS 20.9*  --   --   HGB 15.8 13.6 14.4  HCT 47.2 41.2 42.4  MCV 92.0 91.8 90.6  PLT 252 244 241   Cardiac Enzymes:  Recent Labs Lab 06/19/16 0812 06/20/16 0407  CKTOTAL 153 185   BNP: Invalid input(s): POCBNP CBG: No results for input(s): GLUCAP in the last 168 hours. D-Dimer No results for input(s): DDIMER in the last 72 hours. Hgb A1c  Recent Labs  06/19/16 0812  HGBA1C 5.2   Lipid Profile No results for input(s): CHOL, HDL, LDLCALC, TRIG, CHOLHDL, LDLDIRECT in the last 72 hours. Thyroid function studies No results for input(s): TSH, T4TOTAL, T3FREE, THYROIDAB in the last 72 hours.  Invalid input(s): FREET3 Anemia work up No results for input(s): VITAMINB12, FOLATE, FERRITIN, TIBC, IRON, RETICCTPCT in the last 72 hours. Urinalysis    Component Value Date/Time   COLORURINE YELLOW 06/19/2016 0626   APPEARANCEUR CLEAR 06/19/2016 0626   LABSPEC 1.025 06/19/2016 0626   PHURINE 6.0 06/19/2016 0626   GLUCOSEU 500 (A) 06/19/2016 0626   HGBUR NEGATIVE 06/19/2016 0626   BILIRUBINUR NEGATIVE 06/19/2016 0626   KETONESUR NEGATIVE 06/19/2016 0626   PROTEINUR 30 (A) 06/19/2016 0626   UROBILINOGEN 0.2 06/18/2015 1946   NITRITE NEGATIVE 06/19/2016 0626   LEUKOCYTESUR NEGATIVE 06/19/2016 0626   Sepsis Labs Invalid input(s): PROCALCITONIN,  WBC,  LACTICIDVEN Microbiology Recent Results (from the past 240 hour(s))  Blood culture (routine x 2)     Status: None  (Preliminary result)   Collection Time: 06/19/16  6:39 AM  Result Value Ref Range Status   Specimen Description BLOOD LEFT ANTECUBITAL  Final   Special Requests BOTTLES DRAWN AEROBIC AND ANAEROBIC  5CC  Final   Culture NO GROWTH 1 DAY  Final   Report Status PENDING  Incomplete  Blood culture (routine x 2)     Status: None (Preliminary result)   Collection Time: 06/19/16  6:43 AM  Result Value Ref Range Status   Specimen Description BLOOD LEFT ARM  Final   Special Requests BOTTLES DRAWN AEROBIC AND ANAEROBIC  5CC  Final   Culture NO GROWTH 1 DAY  Final   Report Status PENDING  Incomplete  MRSA PCR Screening     Status: None   Collection Time: 06/19/16  1:23 PM  Result Value Ref Range Status   MRSA by PCR NEGATIVE NEGATIVE Final    Comment:        The GeneXpert MRSA Assay (FDA approved for NASAL specimens only), is one component of a comprehensive MRSA colonization surveillance program. It is not intended to diagnose MRSA infection nor to guide or monitor treatment for MRSA infections.    Time coordinating discharge: 28 minutes  SIGNED:  Standley Dakins, MD  [facility] 06/21/2016, 11:11 AM Pager   If 7PM-7AM, please contact night-coverage [email or web address] Password [email or web address]

## 2016-06-24 LAB — CULTURE, BLOOD (ROUTINE X 2)
CULTURE: NO GROWTH
Culture: NO GROWTH

## 2016-08-08 ENCOUNTER — Ambulatory Visit: Payer: Self-pay | Admitting: Family Medicine

## 2016-08-27 ENCOUNTER — Encounter (HOSPITAL_COMMUNITY): Payer: Self-pay | Admitting: Emergency Medicine

## 2016-08-27 ENCOUNTER — Emergency Department (HOSPITAL_COMMUNITY)
Admission: EM | Admit: 2016-08-27 | Discharge: 2016-08-28 | Disposition: A | Discharge status: Home / Self Care | Payer: Medicaid Other | Attending: Emergency Medicine | Admitting: Emergency Medicine

## 2016-08-27 DIAGNOSIS — J449 Chronic obstructive pulmonary disease, unspecified: Secondary | ICD-10-CM | POA: Diagnosis not present

## 2016-08-27 DIAGNOSIS — T401X1A Poisoning by heroin, accidental (unintentional), initial encounter: Secondary | ICD-10-CM | POA: Insufficient documentation

## 2016-08-27 DIAGNOSIS — F17210 Nicotine dependence, cigarettes, uncomplicated: Secondary | ICD-10-CM | POA: Insufficient documentation

## 2016-08-27 LAB — CBG MONITORING, ED: GLUCOSE-CAPILLARY: 94 mg/dL (ref 65–99)

## 2016-08-27 NOTE — ED Triage Notes (Signed)
Pt from home via EMS following a heroin overdose. Pt was found unresponsive by family who called 911. When EMS arrived at the scene, fire was bagging the patient and had given him 2 mg of Narcan IN with no response. They administered a 2 mg second dose IN and pt became alert. Pt is alert and oriented x 4. Pt has ronchi in all fields of his lungs

## 2016-08-27 NOTE — ED Notes (Signed)
Bed: WA08 Expected date:  Expected time:  Means of arrival:  Comments: 25yo M OD
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
## 2016-08-28 LAB — CBC
HEMATOCRIT: 46 % (ref 39.0–52.0)
HEMOGLOBIN: 15.9 g/dL (ref 13.0–17.0)
MCH: 32.1 pg (ref 26.0–34.0)
MCHC: 34.6 g/dL (ref 30.0–36.0)
MCV: 92.9 fL (ref 78.0–100.0)
Platelets: 198 10*3/uL (ref 150–400)
RBC: 4.95 MIL/uL (ref 4.22–5.81)
RDW: 14.6 % (ref 11.5–15.5)
WBC: 15.3 10*3/uL — AB (ref 4.0–10.5)

## 2016-08-28 LAB — COMPREHENSIVE METABOLIC PANEL
ALBUMIN: 4.1 g/dL (ref 3.5–5.0)
ALK PHOS: 69 U/L (ref 38–126)
ALT: 22 U/L (ref 17–63)
ANION GAP: 10 (ref 5–15)
AST: 36 U/L (ref 15–41)
BUN: 6 mg/dL (ref 6–20)
CHLORIDE: 110 mmol/L (ref 101–111)
CO2: 21 mmol/L — AB (ref 22–32)
Calcium: 8.7 mg/dL — ABNORMAL LOW (ref 8.9–10.3)
Creatinine, Ser: 0.91 mg/dL (ref 0.61–1.24)
GFR calc non Af Amer: >60 mL/min (ref >60)
GLUCOSE: 113 mg/dL — AB (ref 65–99)
POTASSIUM: 4.2 mmol/L (ref 3.5–5.1)
SODIUM: 141 mmol/L (ref 135–145)
Total Bilirubin: 0.8 mg/dL (ref 0.3–1.2)
Total Protein: 7.1 g/dL (ref 6.5–8.1)

## 2016-08-28 LAB — RAPID URINE DRUG SCREEN, HOSP PERFORMED
Amphetamines: NOT DETECTED
BARBITURATES: NOT DETECTED
BENZODIAZEPINES: POSITIVE — AB
COCAINE: POSITIVE — AB
OPIATES: POSITIVE — AB
TETRAHYDROCANNABINOL: NOT DETECTED

## 2016-08-28 LAB — ETHANOL: Alcohol, Ethyl (B): 101 mg/dL — ABNORMAL HIGH (ref <5)

## 2016-08-28 LAB — SALICYLATE LEVEL

## 2016-08-28 LAB — ACETAMINOPHEN LEVEL

## 2016-08-28 NOTE — ED Notes (Signed)
Pt's vitals manually rechecked.  Pt A&Ox4 and stable.

## 2016-08-28 NOTE — Discharge Instructions (Signed)
RESOURCE GUIDE ° °Chronic Pain Problems: °Contact [facility]  [phone] °Patients need to be referred by their primary care doctor. ° °Insufficient Money for Medicine: °Contact [employer]:  call [phone] [phone] ° °No Primary Care Doctor: °Call [facility]  [phone] - can help you locate a primary care doctor that  accepts your insurance, provides certain services, etc. °Physician Referral Service- [phone] ° °Agencies that provide inexpensive medical care: °[facility]  [phone] °[facility]  [phone] °[facility]  [phone] °[facility]  [phone] °[facility]  [phone] °[facility]  [phone] ° °Medicaid-accepting [place] Providers: °[facility]- [address] ° [phone], Mon-Fri 9am-7pm, Sat 9am-1pm °[facility]- [address], [address] ° [phone] °[facility]- [address], [address] ° [phone] °[facility]- [address] [place] [address] ° [phone] °[facility]- [address], [address], [phone] ° Only accepts [place] Access Medicaid patients after they have their name  applied to their card ° °Self Pay (no insurance) in [place]: °Sickle Cell Patients - [facility] ° [address], [phone] °[facility]- [address] ° [phone] °      -      [facility]- [address] °      -     [facility]- see information above (Speak to Pam H if you do not have insurance) °      -  [facility] [place]- [address],  [phone] °      -  [facility]- [address] [place] [address], [phone] °      -  [facility]-  [address], [place], [phone] °      -  [facility] - [address], [phone] °      -  [facility]- [address] [place] [address], [phone], also [address] °  [address],  [phone] °      -     [facility]- [address], [phone], 1st & 3rd Saturday °        every month, 10am-1pm ° -     [facility] °  [address], [place]. °  Phone:  [phone], Fax:  [phone]. Hours of Operation:  9 am - 6 pm, M-F. ° -     [facility] °  [address], [address], [place] °  Phone: [phone], Fax: [phone]. Hours of Operation:  8:30 am - 5:30 pm, M-F. ° ° ° °Dental Assistance °If unable to pay or uninsured, contact:  [facility] [facility] to become qualified for the adult dental clinic. ° °Patients with Medicaid: [facility] °[address], [phone] °[address], [phone] ° °If unable to pay, or uninsured, contact [place] [facility] ([phone] in [place], [phone] in [place]) to become qualified for the adult dental clinic ° °[facility] °[address] °[place], [place] [address] °[phone] °[email or web address] ° °Other Low-Cost Community Dental Services: °[facility]- [address], [place], [place], [address], [phone], Ext. [phone], 2nd and 4th Thursday of the month at 6:30am.  10 clients each day by appointment, can sometimes see walk-in patients if someone does not show for an appointment. °  [facility]- [address], [place], [place], [address], [phone] °[facility]- [address], [place], [place], [address], [phone] °[facility]- [phone] °[facility]- [phone] °[facility]- [phone]  °

## 2018-01-19 ENCOUNTER — Encounter (HOSPITAL_COMMUNITY): Payer: Self-pay | Admitting: Emergency Medicine

## 2018-01-19 ENCOUNTER — Other Ambulatory Visit: Payer: Self-pay

## 2018-01-19 ENCOUNTER — Emergency Department (HOSPITAL_COMMUNITY)
Admission: EM | Admit: 2018-01-19 | Discharge: 2018-01-19 | Disposition: A | Discharge status: Home / Self Care | Payer: Medicaid Other | Attending: Emergency Medicine | Admitting: Emergency Medicine

## 2018-01-19 ENCOUNTER — Emergency Department (HOSPITAL_COMMUNITY): Payer: Medicaid Other

## 2018-01-19 DIAGNOSIS — F17210 Nicotine dependence, cigarettes, uncomplicated: Secondary | ICD-10-CM | POA: Diagnosis not present

## 2018-01-19 DIAGNOSIS — J45901 Unspecified asthma with (acute) exacerbation: Secondary | ICD-10-CM | POA: Diagnosis not present

## 2018-01-19 DIAGNOSIS — R0602 Shortness of breath: Secondary | ICD-10-CM

## 2018-01-19 MED ORDER — ALBUTEROL SULFATE (2.5 MG/3ML) 0.083% IN NEBU
INHALATION_SOLUTION | RESPIRATORY_TRACT | Status: AC
  Filled 2018-01-19: qty 6

## 2018-01-19 MED ORDER — ALBUTEROL SULFATE HFA 108 (90 BASE) MCG/ACT IN AERS
2.0000 | INHALATION_SPRAY | Freq: Four times a day (QID) | RESPIRATORY_TRACT | Status: DC
  Administered 2018-01-19: 2 via RESPIRATORY_TRACT
  Filled 2018-01-19: qty 6.7

## 2018-01-19 MED ORDER — PREDNISONE 20 MG PO TABS: 40.0000 mg | ORAL_TABLET | Freq: Every day | ORAL | 0 refills | Status: DC

## 2018-01-19 MED ORDER — ONDANSETRON 4 MG PO TBDP
8.0000 mg | ORAL_TABLET | Freq: Once | ORAL | Status: AC
  Administered 2018-01-19: 8 mg via ORAL
  Filled 2018-01-19: qty 2

## 2018-01-19 MED ORDER — DEXAMETHASONE SODIUM PHOSPHATE 10 MG/ML IJ SOLN
10.0000 mg | Freq: Once | INTRAMUSCULAR | Status: AC
  Administered 2018-01-19: 10 mg via INTRAMUSCULAR
  Filled 2018-01-19: qty 1

## 2018-01-19 MED ORDER — IPRATROPIUM-ALBUTEROL 0.5-2.5 (3) MG/3ML IN SOLN
3.0000 mL | Freq: Once | RESPIRATORY_TRACT | Status: AC
Start: 2018-01-19 — End: 2018-01-19
  Administered 2018-01-19: 3 mL via RESPIRATORY_TRACT
  Filled 2018-01-19: qty 3

## 2018-01-19 NOTE — ED Notes (Signed)
Patient 87% on RA upon arrival

## 2018-01-19 NOTE — Discharge Instructions (Signed)
As discussed, your evaluation tonight has been generally reassuring. Please use the provided albuterol every 4 hours for the next 2 days, then you may use it as needed. Is also important to obtain the prescribed steroids, and take them as directed. Return here for concerning changes in your condition.

## 2018-01-19 NOTE — ED Notes (Signed)
Pt understood dc material. NAD Noted. Pt used Teach back method and used inhaler prior to dc
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
## 2018-01-19 NOTE — ED Triage Notes (Signed)
Patient presents to ED for asthma exacerbation starting a few days, but significantly worsening today when patient ran out of inhaler.  Very little air movement, with expiratory wheezes noted in lower fields.

## 2018-03-08 ENCOUNTER — Ambulatory Visit (HOSPITAL_COMMUNITY)
Admission: EM | Admit: 2018-03-08 | Discharge: 2018-03-08 | Disposition: A | Discharge status: Home / Self Care | Payer: Medicaid Other | Attending: Emergency Medicine | Admitting: Emergency Medicine

## 2018-03-08 ENCOUNTER — Encounter (HOSPITAL_COMMUNITY): Payer: Self-pay | Admitting: Emergency Medicine

## 2018-03-08 DIAGNOSIS — H9202 Otalgia, left ear: Secondary | ICD-10-CM | POA: Diagnosis not present

## 2018-03-08 DIAGNOSIS — J449 Chronic obstructive pulmonary disease, unspecified: Secondary | ICD-10-CM | POA: Diagnosis not present

## 2018-03-08 DIAGNOSIS — J209 Acute bronchitis, unspecified: Secondary | ICD-10-CM

## 2018-03-08 DIAGNOSIS — F17210 Nicotine dependence, cigarettes, uncomplicated: Secondary | ICD-10-CM | POA: Diagnosis not present

## 2018-03-08 DIAGNOSIS — R05 Cough: Secondary | ICD-10-CM | POA: Insufficient documentation

## 2018-03-08 MED ORDER — AZITHROMYCIN 250 MG PO TABS: 250.0000 mg | ORAL_TABLET | Freq: Every day | ORAL | 0 refills | Status: AC

## 2018-03-08 MED ORDER — FLUTICASONE PROPIONATE 50 MCG/ACT NA SUSP: 1.0000 | Freq: Every day | NASAL | 0 refills | Status: AC

## 2018-03-08 MED ORDER — PREDNISONE 50 MG PO TABS: 50.0000 mg | ORAL_TABLET | Freq: Every day | ORAL | 0 refills | Status: AC

## 2018-03-08 MED ORDER — ALBUTEROL SULFATE HFA 108 (90 BASE) MCG/ACT IN AERS: 1.0000 | INHALATION_SPRAY | Freq: Four times a day (QID) | RESPIRATORY_TRACT | 0 refills | Status: AC | PRN

## 2018-03-08 MED ORDER — CETIRIZINE HCL 10 MG PO CAPS: 10.0000 mg | ORAL_CAPSULE | Freq: Every day | ORAL | 0 refills | Status: AC

## 2018-03-08 MED ORDER — BENZONATATE 200 MG PO CAPS: 200.0000 mg | ORAL_CAPSULE | Freq: Three times a day (TID) | ORAL | 0 refills | Status: AC

## 2018-03-08 NOTE — ED Triage Notes (Signed)
Pt c/o cough, fever, states "I cant sleep because im coughing".
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
## 2018-03-08 NOTE — Discharge Instructions (Signed)
For congestion please begin daily Zyrtec/cetirizine as well as Flonase nasal spray.  For cough please use Tessalon as needed.  Please also a mix honey and hot tea.  Begin prednisone daily for the next 5 days  If you are not feeling better in the next 3 to 4 days, you may fill prescription for azithromycin, take 2 tablets on day 1 followed by 1 tablet for the following 4 days  Please return if symptoms not improving with treatment, please return sooner if developing shortness of breath, difficulty breathing or worsening symptoms

## 2018-03-08 NOTE — ED Triage Notes (Signed)
Pt also c/o L ear pain.

## 2018-03-11 LAB — CULTURE, GROUP A STREP (THRC)

## 2018-03-11 LAB — POCT RAPID STREP A: Streptococcus, Group A Screen (Direct): NEGATIVE

## 2018-05-25 IMAGING — DX DG CHEST 2V
2 series · 2 of 2 positions shown · non-contrast
Comparison: 06/19/2016 chest radiograph.

CLINICAL DATA: Dyspnea, asthma

EXAM:
CHEST - 2 VIEW

[chest pa]
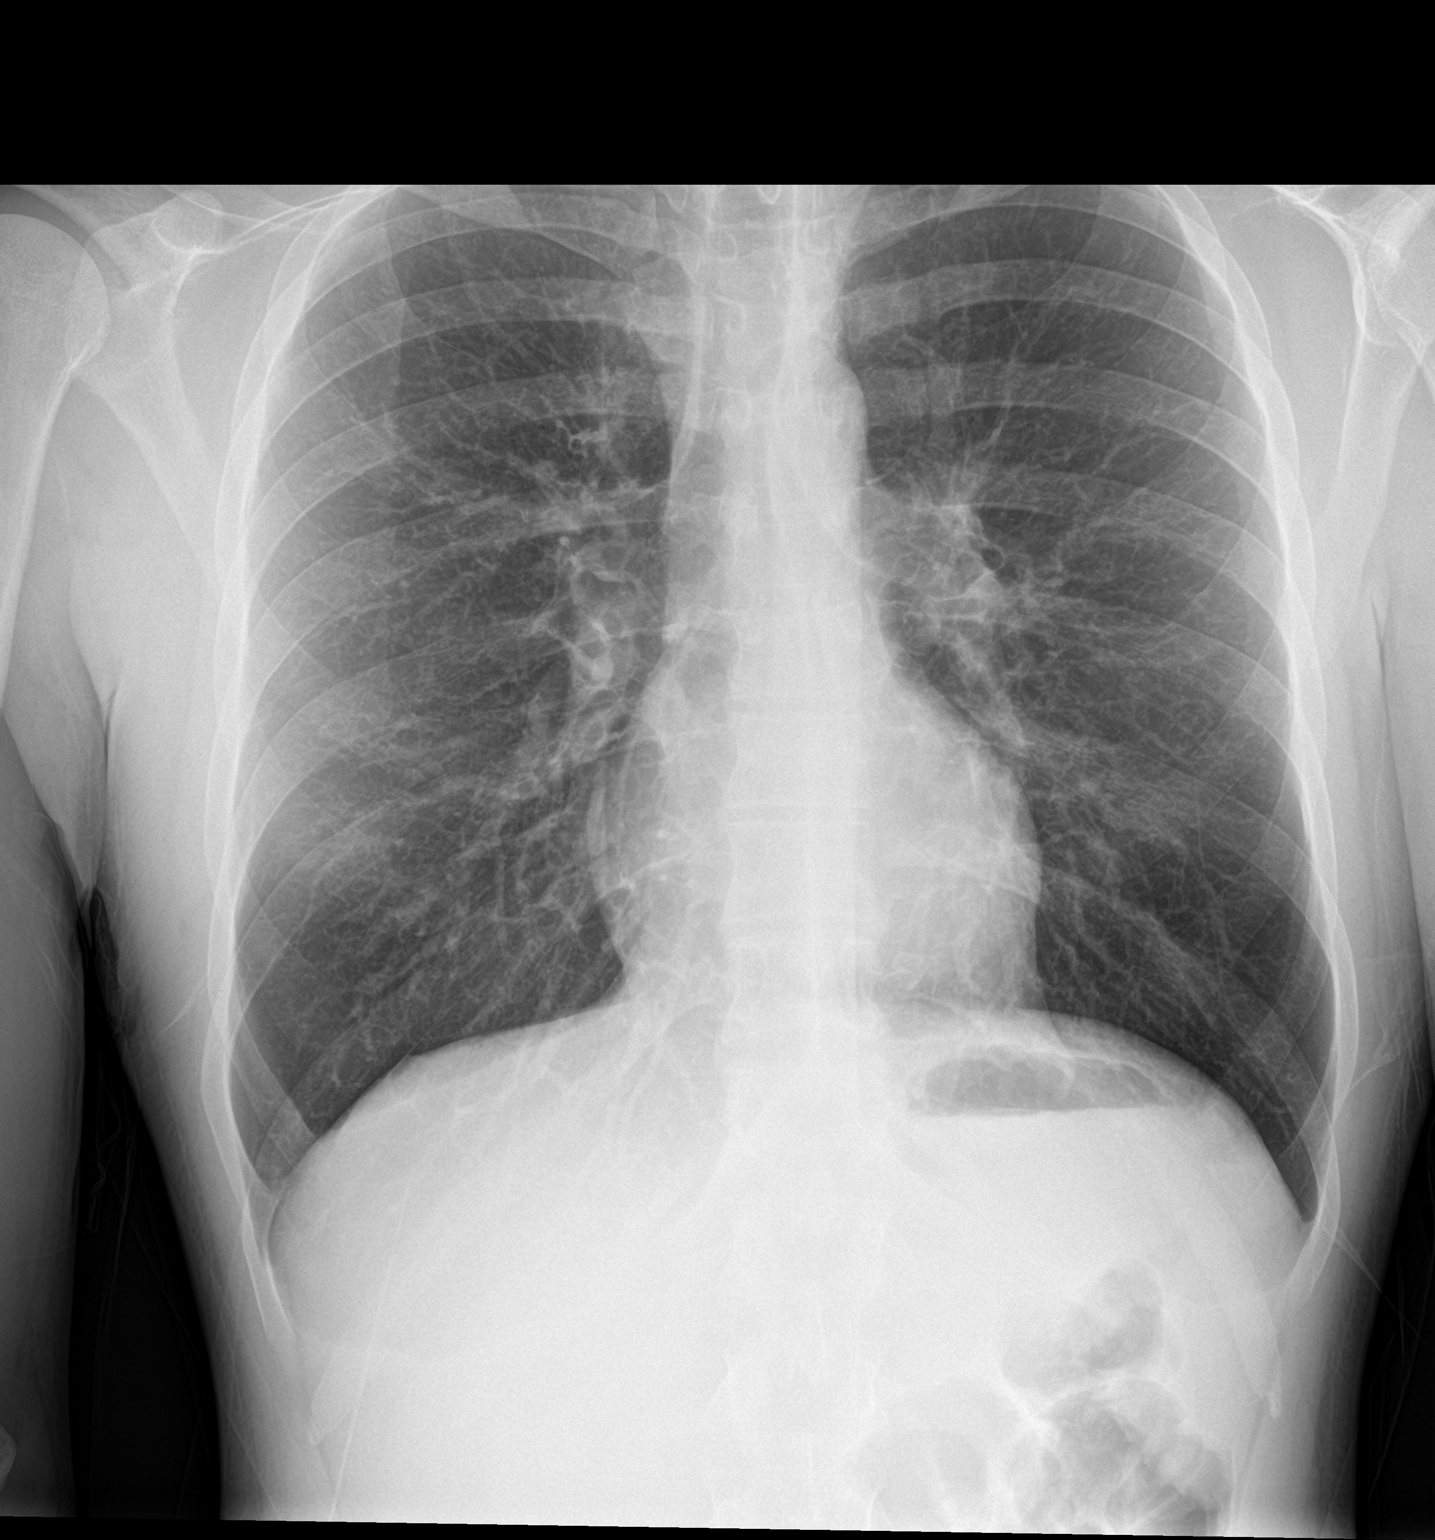

[chest lat]
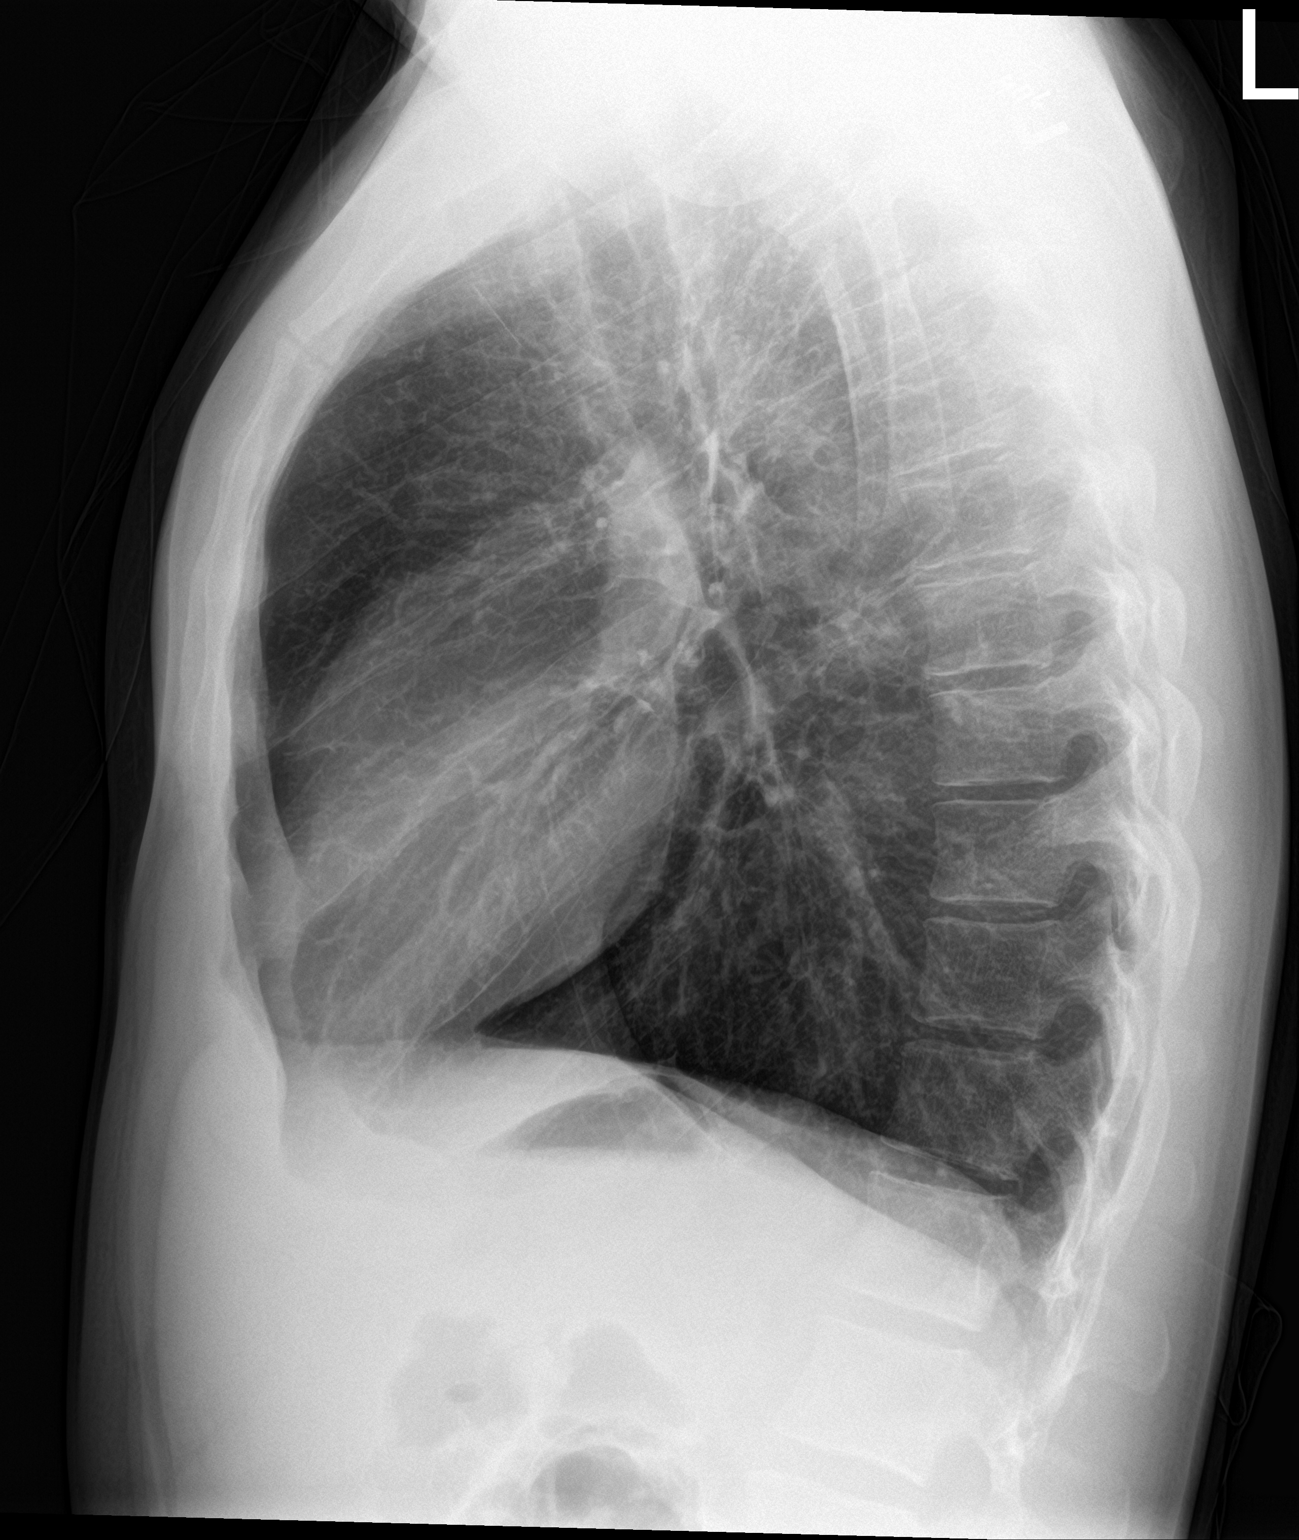

[2 of 2 positions shown; findings below may reference images not displayed]

FINDINGS: Stable cardiomediastinal silhouette with normal heart size. No
pneumothorax. No pleural effusion. Hyperinflated lungs. No pulmonary
edema. No acute consolidative airspace disease.
IMPRESSION: Hyperinflated lungs, suggesting obstructive lung disease. Otherwise
no active disease in the chest.

## 2018-05-31 ENCOUNTER — Emergency Department (HOSPITAL_COMMUNITY)
Admission: EM | Admit: 2018-05-31 | Discharge: 2018-05-31 | Disposition: A | Discharge status: Home / Self Care | Payer: Medicaid Other | Attending: Emergency Medicine | Admitting: Emergency Medicine

## 2018-05-31 ENCOUNTER — Other Ambulatory Visit: Payer: Self-pay

## 2018-05-31 ENCOUNTER — Emergency Department (HOSPITAL_COMMUNITY): Payer: Medicaid Other

## 2018-05-31 DIAGNOSIS — J449 Chronic obstructive pulmonary disease, unspecified: Secondary | ICD-10-CM | POA: Insufficient documentation

## 2018-05-31 DIAGNOSIS — F1110 Opioid abuse, uncomplicated: Secondary | ICD-10-CM | POA: Insufficient documentation

## 2018-05-31 DIAGNOSIS — F17210 Nicotine dependence, cigarettes, uncomplicated: Secondary | ICD-10-CM | POA: Insufficient documentation

## 2018-05-31 DIAGNOSIS — Z79899 Other long term (current) drug therapy: Secondary | ICD-10-CM | POA: Diagnosis not present

## 2018-05-31 DIAGNOSIS — R569 Unspecified convulsions: Secondary | ICD-10-CM | POA: Diagnosis not present

## 2018-05-31 LAB — CBC WITH DIFFERENTIAL/PLATELET
Basophils Absolute: 0 10*3/uL (ref 0.0–0.1)
Basophils Relative: 0 %
Eosinophils Absolute: 0.1 10*3/uL (ref 0.0–0.7)
Eosinophils Relative: 1 %
HCT: 42.3 % (ref 39.0–52.0)
Hemoglobin: 14.7 g/dL (ref 13.0–17.0)
Lymphocytes Relative: 23 %
Lymphs Abs: 2.6 10*3/uL (ref 0.7–4.0)
MCH: 32.4 pg (ref 26.0–34.0)
MCHC: 34.8 g/dL (ref 30.0–36.0)
MCV: 93.2 fL (ref 78.0–100.0)
Monocytes Absolute: 0.7 10*3/uL (ref 0.1–1.0)
Monocytes Relative: 6 %
Neutro Abs: 8.1 10*3/uL — ABNORMAL HIGH (ref 1.7–7.7)
Neutrophils Relative %: 70 %
Platelets: 274 10*3/uL (ref 150–400)
RBC: 4.54 MIL/uL (ref 4.22–5.81)
RDW: 13.5 % (ref 11.5–15.5)
WBC: 11.5 10*3/uL — ABNORMAL HIGH (ref 4.0–10.5)

## 2018-05-31 LAB — RAPID URINE DRUG SCREEN, HOSP PERFORMED
Amphetamines: NOT DETECTED
Barbiturates: NOT DETECTED
Benzodiazepines: NOT DETECTED
Cocaine: POSITIVE — AB
Opiates: POSITIVE — AB
Tetrahydrocannabinol: NOT DETECTED

## 2018-05-31 LAB — CBG MONITORING, ED
Glucose-Capillary: 96 mg/dL (ref 70–99)
Glucose-Capillary: 102 mg/dL — ABNORMAL HIGH (ref 70–99)

## 2018-05-31 LAB — BASIC METABOLIC PANEL
Anion gap: 6 (ref 5–15)
BUN: 8 mg/dL (ref 6–20)
CO2: 28 mmol/L (ref 22–32)
Calcium: 9 mg/dL (ref 8.9–10.3)
Chloride: 110 mmol/L (ref 98–111)
Creatinine, Ser: 0.81 mg/dL (ref 0.61–1.24)
GFR calc Af Amer: >60 mL/min (ref >60)
GFR calc non Af Amer: >60 mL/min (ref >60)
Glucose, Bld: 123 mg/dL — ABNORMAL HIGH (ref 70–99)
Potassium: 3.4 mmol/L — ABNORMAL LOW (ref 3.5–5.1)
Sodium: 144 mmol/L (ref 135–145)

## 2018-05-31 LAB — ETHANOL: Alcohol, Ethyl (B): <10 mg/dL (ref <10)

## 2018-05-31 MED ORDER — SODIUM CHLORIDE 0.9 % IV SOLN
INTRAVENOUS | Status: DC
  Administered 2018-05-31: 20:00:00 via INTRAVENOUS

## 2018-05-31 MED ORDER — SODIUM CHLORIDE 0.9 % IV BOLUS
1000.0000 mL | Freq: Once | INTRAVENOUS | Status: AC
  Administered 2018-05-31: 1000 mL via INTRAVENOUS

## 2018-05-31 MED ORDER — POTASSIUM CHLORIDE CRYS ER 20 MEQ PO TBCR
40.0000 meq | EXTENDED_RELEASE_TABLET | Freq: Once | ORAL | Status: AC
  Administered 2018-05-31: 40 meq via ORAL
  Filled 2018-05-31: qty 2

## 2018-05-31 NOTE — ED Triage Notes (Addendum)
Per EMS-states he was in the front yard talking on the phone-neighbor found him actively seizing-last a couple of minutes-post ictal when EMS arrived-states seizures occur after he uses narcotics-last use was today-patient refused C-collar and towel roll

## 2018-05-31 NOTE — ED Notes (Signed)
[facility] Expected date:  Expected time:  Means of arrival:  Comments: EMS-SZ

## 2018-05-31 NOTE — Discharge Instructions (Signed)
Your lab work today was reassuring.  Your head CT did not show any signs of head bleed or mass.  Follow-up with a neurologist for reevaluation of your seizures.  Return to the emergency department if any concerning signs or symptoms develop such as repeat seizure, confusion, loss of consciousness, weakness, or fevers.
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
## 2018-10-04 IMAGING — CT CT HEAD W/O CM
3 series · 16 of 47 positions shown, 19 images · non-contrast
Comparison: June 19, 2016

CLINICAL DATA: Seizure.

EXAM:
CT HEAD WITHOUT CONTRAST
TECHNIQUE: Contiguous axial images were obtained from the base of the skull
through the vertex without intravenous contrast.

[Series 2: head wo · axial · 0.47mm/px · z∈[-165,-35]mm · 10 of 32 slices shown, 13 images]
[im 3/32  brain]
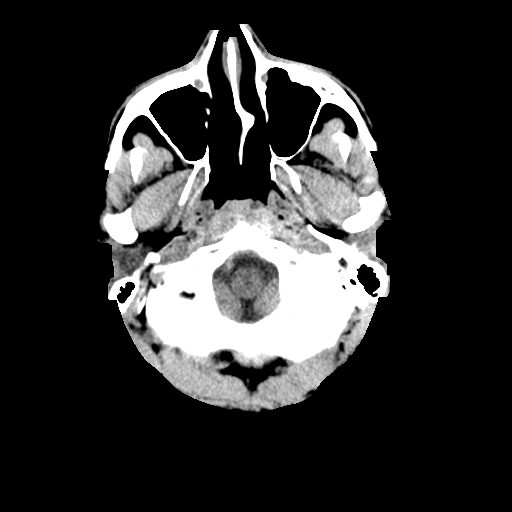
[im 3/32  bone]
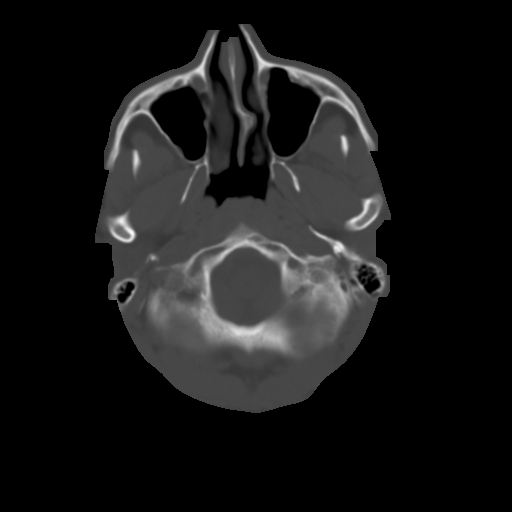
[im 6/32  brain]
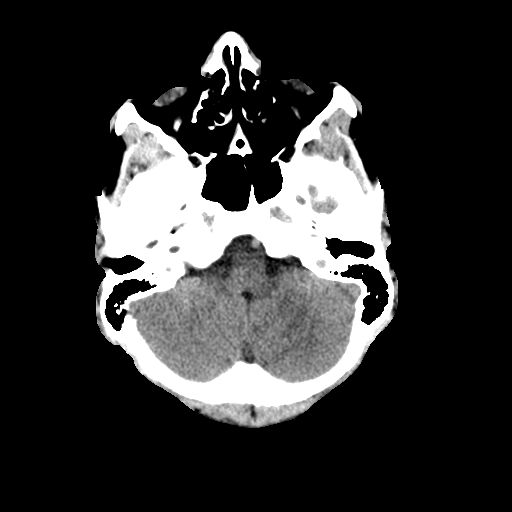
[im 9/32  brain]
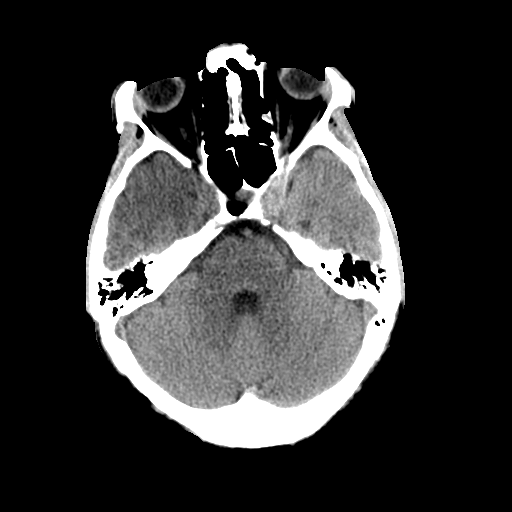
[im 11/32  brain]
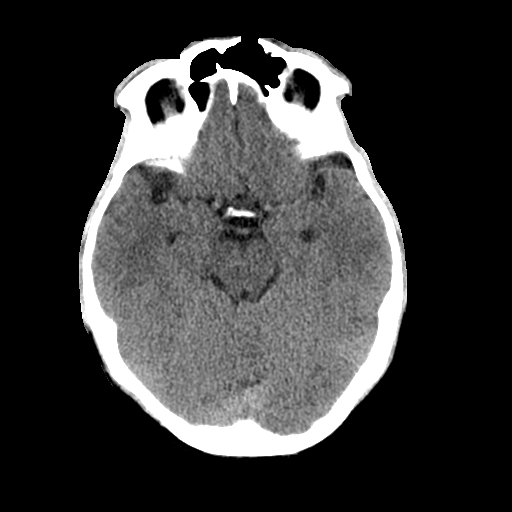
[im 14/32  brain]
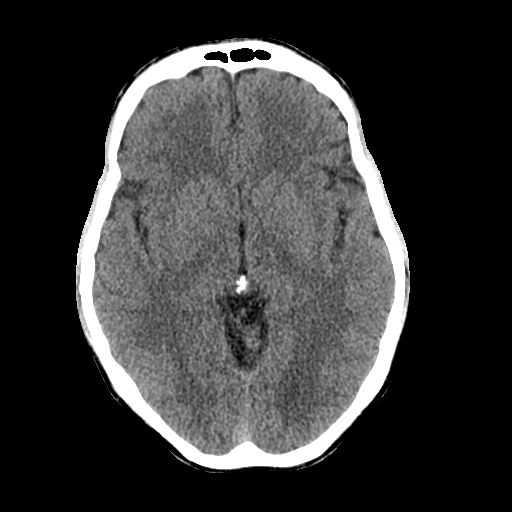
[im 14/32  bone]
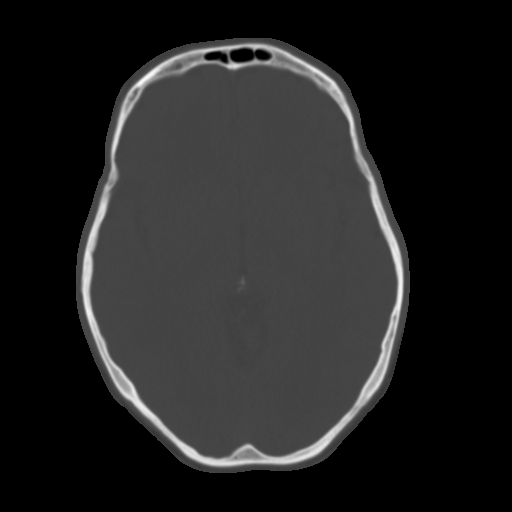
[im 18/32  brain]
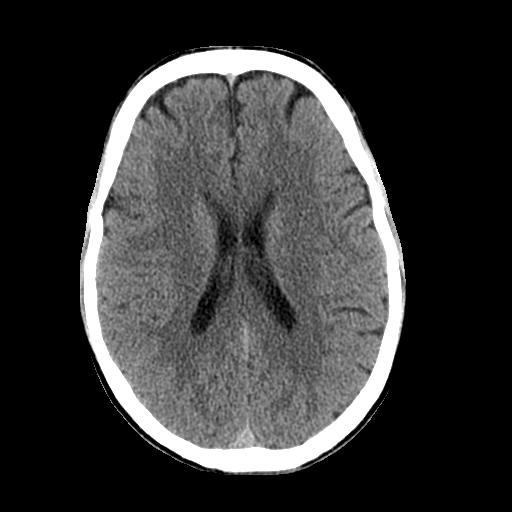
[im 21/32  brain]
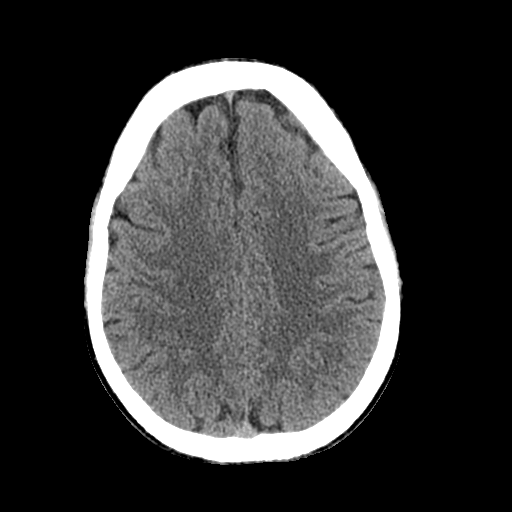
[im 24/32  brain]
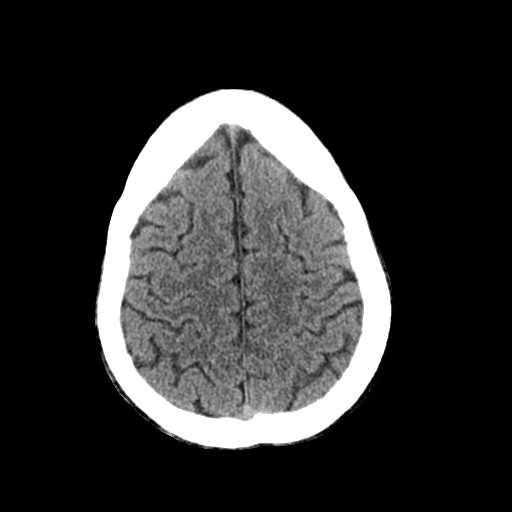
[im 26/32  brain]
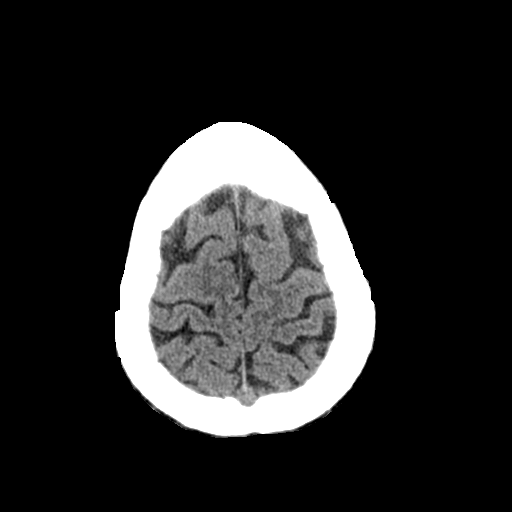
[im 26/32  bone]
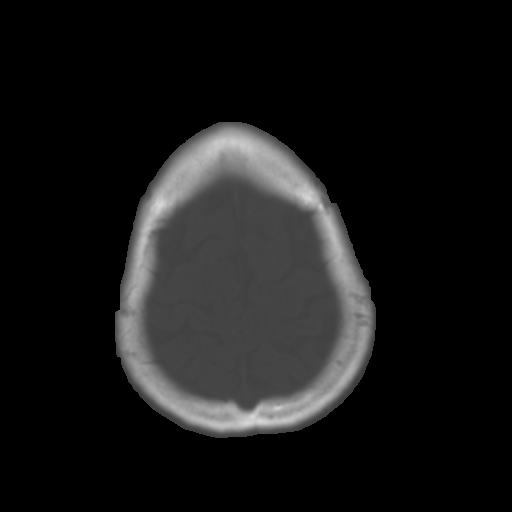
[im 29/32  brain]
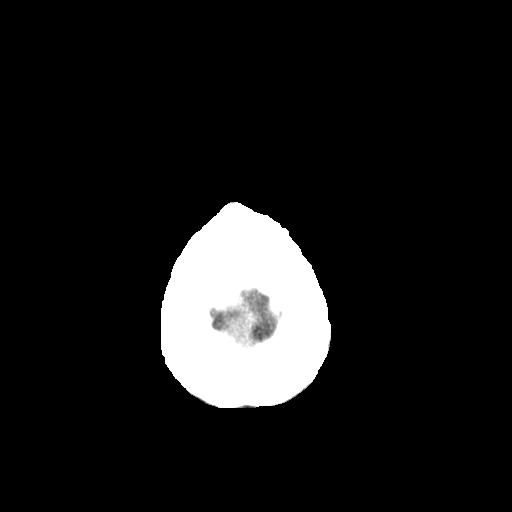

[Series 5: coronal soft tissue · coronal · 0.31mm/px · 3 of 71 slices shown]
[im 24/71  brain]
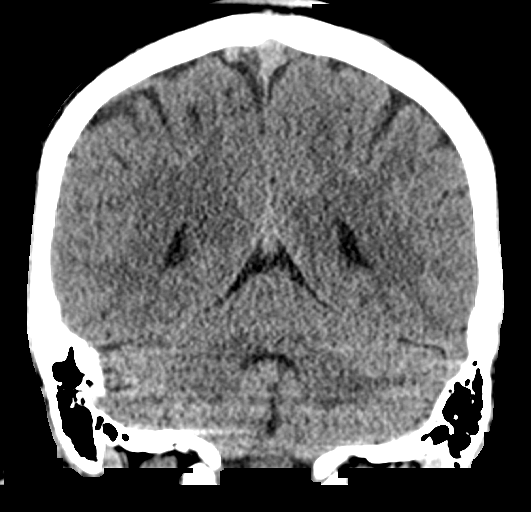
[im 32/71  brain]
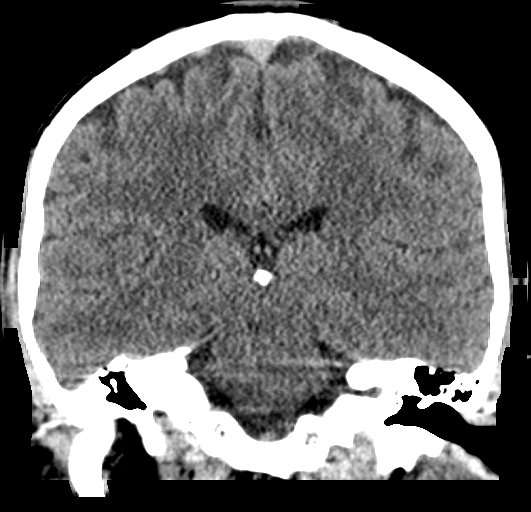
[im 39/71  brain]
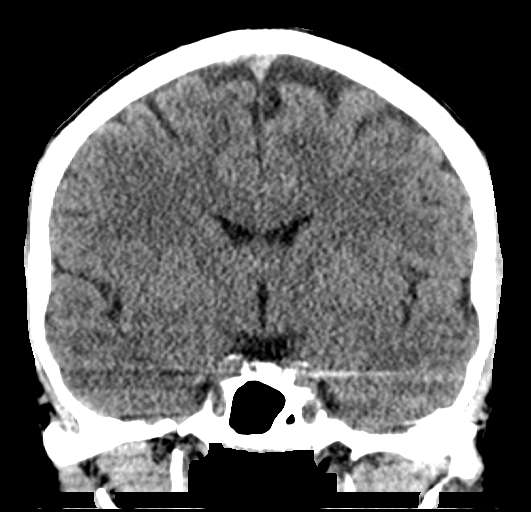

[Series 6: sagittal soft tissue · sagittal · 0.31mm/px · 3 of 56 slices shown]
[im 19/56  brain]
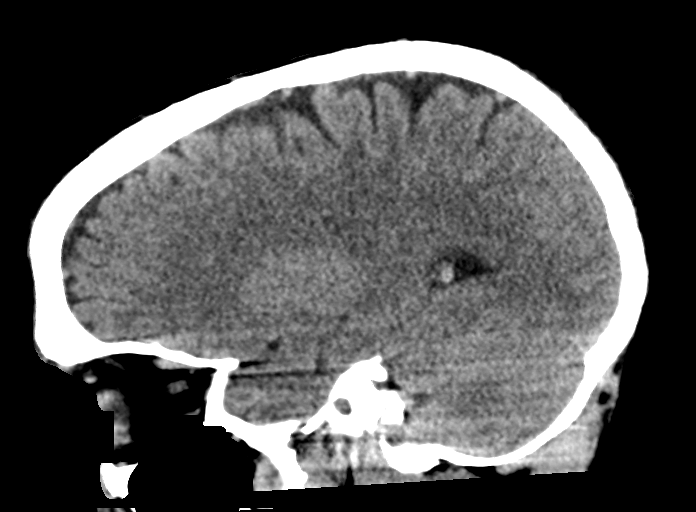
[im 28/56  brain]
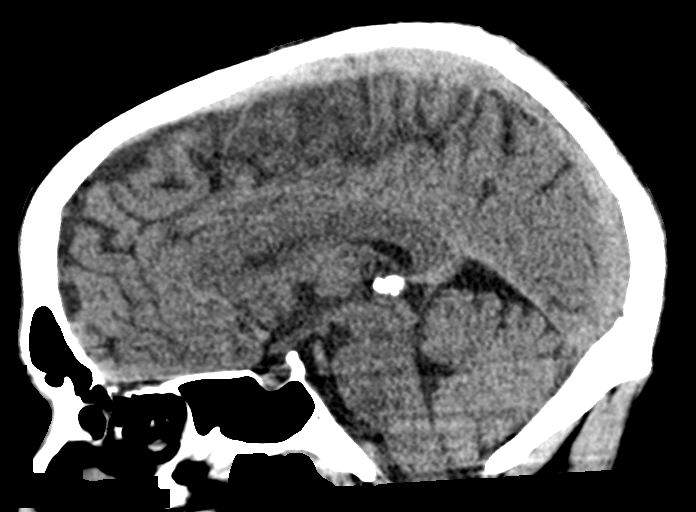
[im 37/56  brain]
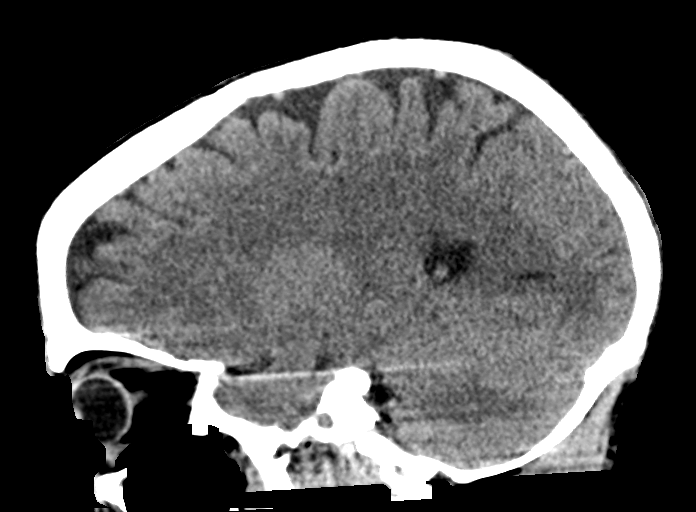

[16 of 47 positions shown; findings below may reference images not displayed]

FINDINGS: Brain: No subdural, epidural, or subarachnoid hemorrhage. No mass
effect or midline shift. Ventricles and sulci are unremarkable.
Cerebellum, brainstem, and basal cisterns normal. No acute cortical
ischemia infarct.

Vascular: No hyperdense vessel or unexpected calcification.

Skull: Normal. Negative for fracture or focal lesion.

Sinuses/Orbits: No acute finding.

Other: None.
IMPRESSION: Normal brain.

## 2018-10-06 DEATH — deceased

## 2024-05-23 ENCOUNTER — Encounter: Payer: Self-pay | Admitting: Advanced Practice Midwife
